# Patient Record
Sex: Female | Born: 1935 | Race: White | Hispanic: No | Marital: Married | Smoking: Never smoker
Health system: Southern US, Community
[De-identification: ages and names within clinical notes are randomized; demographics above are authoritative.]

## PROBLEM LIST (undated history)

## (undated) DIAGNOSIS — U071 COVID-19: Secondary | ICD-10-CM

## (undated) DIAGNOSIS — I10 Essential (primary) hypertension: Secondary | ICD-10-CM

## (undated) HISTORY — PX: APPENDECTOMY: SHX54

## (undated) HISTORY — PX: TONSILLECTOMY: SUR1361

## (undated) HISTORY — PX: ABDOMINAL HYSTERECTOMY: SHX81

---

## 1998-06-29 ENCOUNTER — Ambulatory Visit (HOSPITAL_COMMUNITY): Admission: RE | Admit: 1998-06-29 | Discharge: 1998-06-29 | Payer: Self-pay | Admitting: Neurosurgery

## 1998-07-13 ENCOUNTER — Ambulatory Visit (HOSPITAL_COMMUNITY): Admission: RE | Admit: 1998-07-13 | Discharge: 1998-07-13 | Payer: Self-pay | Admitting: Neurosurgery

## 1998-07-27 ENCOUNTER — Ambulatory Visit (HOSPITAL_COMMUNITY): Admission: RE | Admit: 1998-07-27 | Discharge: 1998-07-27 | Payer: Self-pay | Admitting: Neurosurgery

## 1999-12-15 ENCOUNTER — Other Ambulatory Visit: Admission: RE | Admit: 1999-12-15 | Discharge: 1999-12-15 | Payer: Self-pay | Admitting: Internal Medicine

## 2001-02-07 ENCOUNTER — Other Ambulatory Visit: Admission: RE | Admit: 2001-02-07 | Discharge: 2001-02-07 | Payer: Self-pay | Admitting: Internal Medicine

## 2001-03-11 ENCOUNTER — Ambulatory Visit (HOSPITAL_COMMUNITY): Admission: RE | Admit: 2001-03-11 | Discharge: 2001-03-11 | Payer: Self-pay | Admitting: Internal Medicine

## 2001-03-11 ENCOUNTER — Encounter: Payer: Self-pay | Admitting: Internal Medicine

## 2001-07-14 ENCOUNTER — Ambulatory Visit (HOSPITAL_COMMUNITY): Admission: RE | Admit: 2001-07-14 | Discharge: 2001-07-14 | Payer: Self-pay | Admitting: *Deleted

## 2001-07-14 ENCOUNTER — Encounter (INDEPENDENT_AMBULATORY_CARE_PROVIDER_SITE_OTHER): Payer: Self-pay | Admitting: Specialist

## 2002-05-15 ENCOUNTER — Other Ambulatory Visit: Admission: RE | Admit: 2002-05-15 | Discharge: 2002-05-15 | Payer: Self-pay | Admitting: Internal Medicine

## 2002-08-12 ENCOUNTER — Encounter: Payer: Self-pay | Admitting: Internal Medicine

## 2002-08-12 ENCOUNTER — Ambulatory Visit (HOSPITAL_COMMUNITY): Admission: RE | Admit: 2002-08-12 | Discharge: 2002-08-12 | Payer: Self-pay | Admitting: Internal Medicine

## 2004-04-24 ENCOUNTER — Encounter: Admission: RE | Admit: 2004-04-24 | Discharge: 2004-04-24 | Payer: Self-pay | Admitting: Internal Medicine

## 2004-06-05 ENCOUNTER — Ambulatory Visit (HOSPITAL_COMMUNITY): Admission: RE | Admit: 2004-06-05 | Discharge: 2004-06-05 | Payer: Self-pay | Admitting: Internal Medicine

## 2004-10-26 ENCOUNTER — Encounter: Admission: RE | Admit: 2004-10-26 | Discharge: 2004-10-26 | Payer: Self-pay | Admitting: Internal Medicine

## 2005-02-14 ENCOUNTER — Ambulatory Visit (HOSPITAL_COMMUNITY): Admission: RE | Admit: 2005-02-14 | Discharge: 2005-02-14 | Payer: Self-pay | Admitting: *Deleted

## 2005-02-14 ENCOUNTER — Encounter (INDEPENDENT_AMBULATORY_CARE_PROVIDER_SITE_OTHER): Payer: Self-pay | Admitting: Specialist

## 2005-06-06 ENCOUNTER — Ambulatory Visit (HOSPITAL_COMMUNITY): Admission: RE | Admit: 2005-06-06 | Discharge: 2005-06-06 | Payer: Self-pay | Admitting: Internal Medicine

## 2006-05-09 ENCOUNTER — Encounter: Admission: RE | Admit: 2006-05-09 | Discharge: 2006-05-09 | Payer: Self-pay | Admitting: Internal Medicine

## 2006-09-17 ENCOUNTER — Ambulatory Visit (HOSPITAL_COMMUNITY): Admission: RE | Admit: 2006-09-17 | Discharge: 2006-09-17 | Payer: Self-pay | Admitting: Internal Medicine

## 2007-10-03 ENCOUNTER — Ambulatory Visit (HOSPITAL_COMMUNITY): Admission: RE | Admit: 2007-10-03 | Discharge: 2007-10-03 | Payer: Self-pay | Admitting: Internal Medicine

## 2008-07-25 ENCOUNTER — Emergency Department (HOSPITAL_COMMUNITY): Admission: EM | Admit: 2008-07-25 | Discharge: 2008-07-25 | Payer: Self-pay | Admitting: Emergency Medicine

## 2009-04-01 ENCOUNTER — Ambulatory Visit (HOSPITAL_COMMUNITY): Admission: RE | Admit: 2009-04-01 | Discharge: 2009-04-01 | Payer: Self-pay | Admitting: Internal Medicine

## 2009-06-25 ENCOUNTER — Emergency Department (HOSPITAL_COMMUNITY): Admission: EM | Admit: 2009-06-25 | Discharge: 2009-06-25 | Payer: Self-pay | Admitting: Emergency Medicine

## 2009-06-28 ENCOUNTER — Emergency Department (HOSPITAL_COMMUNITY): Admission: EM | Admit: 2009-06-28 | Discharge: 2009-06-28 | Payer: Self-pay | Admitting: Emergency Medicine

## 2009-06-28 ENCOUNTER — Ambulatory Visit (HOSPITAL_COMMUNITY): Admission: RE | Admit: 2009-06-28 | Discharge: 2009-06-28 | Payer: Self-pay | Admitting: Orthopaedic Surgery

## 2009-07-01 ENCOUNTER — Ambulatory Visit (HOSPITAL_COMMUNITY): Admission: RE | Admit: 2009-07-01 | Discharge: 2009-07-01 | Payer: Self-pay | Admitting: Orthopaedic Surgery

## 2009-07-21 ENCOUNTER — Encounter: Payer: Self-pay | Admitting: Orthopaedic Surgery

## 2009-07-22 ENCOUNTER — Encounter (INDEPENDENT_AMBULATORY_CARE_PROVIDER_SITE_OTHER): Payer: Self-pay | Admitting: Interventional Radiology

## 2009-07-22 ENCOUNTER — Ambulatory Visit (HOSPITAL_COMMUNITY): Admission: RE | Admit: 2009-07-22 | Discharge: 2009-07-22 | Payer: Self-pay | Admitting: Orthopaedic Surgery

## 2009-07-27 ENCOUNTER — Ambulatory Visit (HOSPITAL_COMMUNITY): Admission: RE | Admit: 2009-07-27 | Discharge: 2009-07-27 | Payer: Self-pay | Admitting: Orthopaedic Surgery

## 2009-08-01 ENCOUNTER — Inpatient Hospital Stay (HOSPITAL_COMMUNITY): Admission: EM | Admit: 2009-08-01 | Discharge: 2009-08-08 | Payer: Self-pay | Admitting: Emergency Medicine

## 2009-08-04 ENCOUNTER — Ambulatory Visit: Payer: Self-pay | Admitting: Infectious Diseases

## 2009-08-08 ENCOUNTER — Inpatient Hospital Stay: Admission: AD | Admit: 2009-08-08 | Discharge: 2009-09-30 | Payer: Self-pay | Admitting: Internal Medicine

## 2009-08-16 ENCOUNTER — Ambulatory Visit (HOSPITAL_COMMUNITY): Admission: RE | Admit: 2009-08-16 | Discharge: 2009-08-16 | Payer: Self-pay | Admitting: Internal Medicine

## 2009-08-25 ENCOUNTER — Ambulatory Visit (HOSPITAL_COMMUNITY): Admission: RE | Admit: 2009-08-25 | Discharge: 2009-08-25 | Payer: Self-pay | Admitting: Internal Medicine

## 2009-09-30 ENCOUNTER — Inpatient Hospital Stay (HOSPITAL_COMMUNITY): Admission: RE | Admit: 2009-09-30 | Discharge: 2009-10-04 | Payer: Self-pay | Admitting: General Surgery

## 2009-10-04 ENCOUNTER — Inpatient Hospital Stay: Admission: AD | Admit: 2009-10-04 | Discharge: 2009-10-29 | Payer: Self-pay | Admitting: Internal Medicine

## 2009-11-16 ENCOUNTER — Encounter: Admission: RE | Admit: 2009-11-16 | Discharge: 2009-11-16 | Payer: Self-pay | Admitting: Internal Medicine

## 2009-12-03 IMAGING — CR DG CHEST 2V
1 series · 1 of 1 positions shown · non-contrast
Comparison: Chest x-ray of 07/25/2008 and CT chest of 07/01/2009

CLINICAL DATA: Back pain

CHEST - 2 VIEW

[view not recorded]
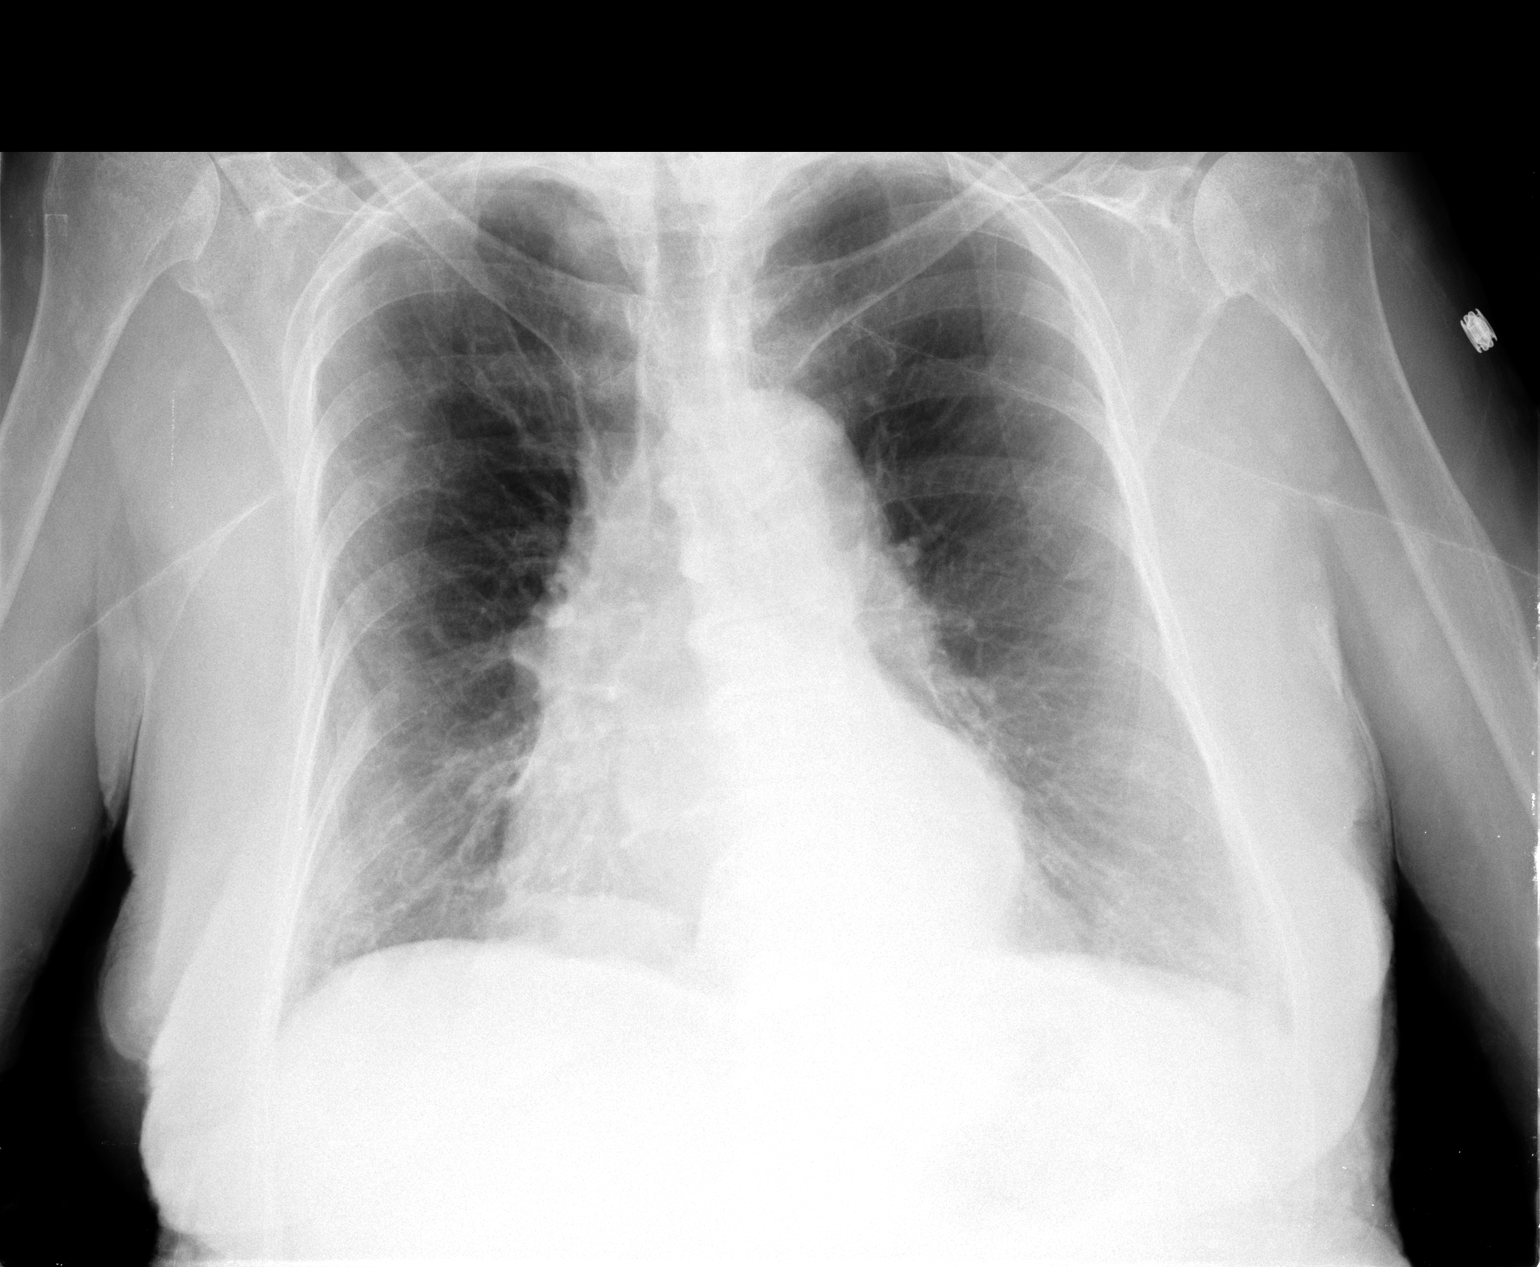

[1 of 1 positions shown; findings below may reference images not displayed]

FINDINGS: The lungs are clear.  Mild cardiomegaly is stable.
There are degenerative changes throughout the thoracic spine.  A
rounded opacity posteriorly at the left lung base may represent the
known posterior mediastinal mass demonstrated on the CT of the
chest of 07/01/2009.
IMPRESSION: 1.  No active lung disease.
2.  Stable mild cardiomegaly.
3.  Rounded mass posteriorly on the lateral view may represent the
previously demonstrated posterior mediastinal soft tissue mass.

## 2009-12-04 IMAGING — CR DG CHEST 1V PORT
1 series · 1 of 1 positions shown · non-contrast
Comparison: Chest radiograph performed 08/03/2009, and CT of the
chest performed 07/01/2009

CLINICAL DATA: Check PICC line placement.

PORTABLE CHEST - 1 VIEW

[view not recorded]
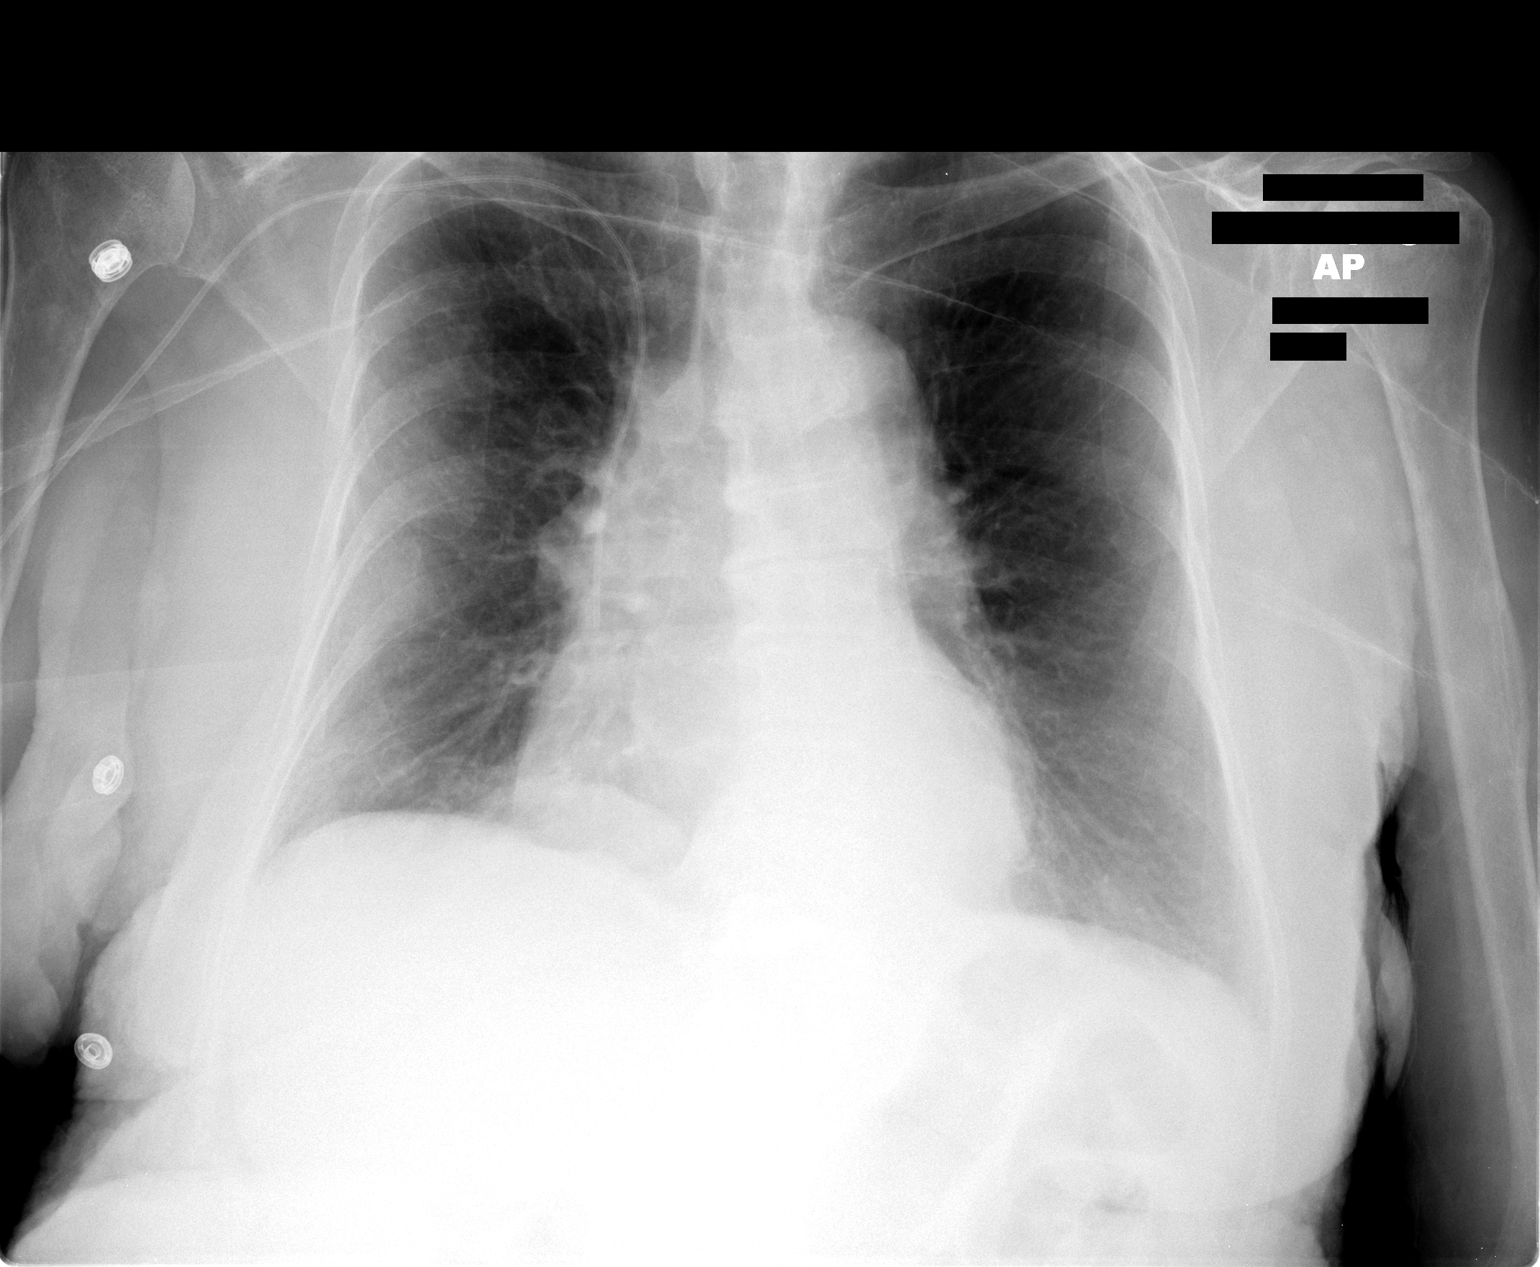

[1 of 1 positions shown; findings below may reference images not displayed]

FINDINGS: There has been interval placement of a right PICC, which
is seen ending at the distal SVC.

The lungs are well-aerated and clear.  There is no evidence of
focal opacification, pleural effusion or pneumothorax.

A left posterior mediastinal mass is unchanged in appearance.  A
rounded density at the right lung base reflects extrapleural fat,
as previously characterized.  No acute osseous abnormalities are
seen.
IMPRESSION: Right PICC noted ending at the distal SVC.

## 2009-12-05 IMAGING — CT CT GUIDANCE NEEDLE PLACEMENT
1 of 2 series · 9 of 14 positions shown, 12 images · non-contrast
Comparison: none

CLINICAL DATA: Persistent pain after T12 kyphoplasty with MRI
demonstrating abnormal soft tissue around the vertebral body.
Request has been made to try to aspirate fluid and/or tissue to
determine whether there is a component of infection.

[Series 2: t=12 asp · axial · 0.42mm/px · z∈[-344,-299]mm · 9 of 114 slices shown, 12 images]
[im 12/114  soft-tissue]
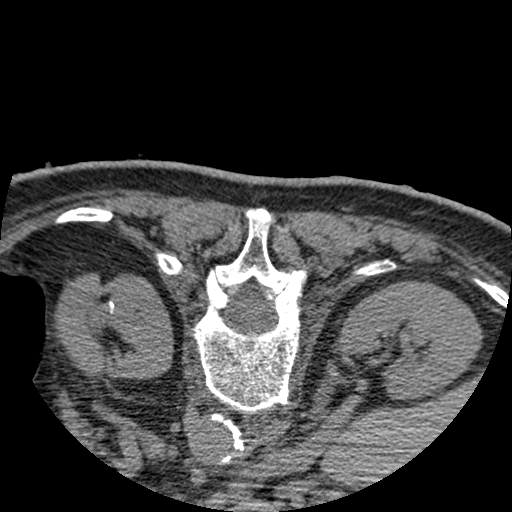
[im 12/114  bone]
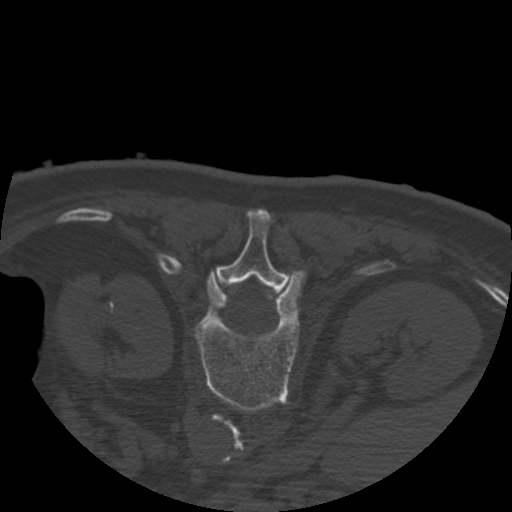
[im 23/114  bone]
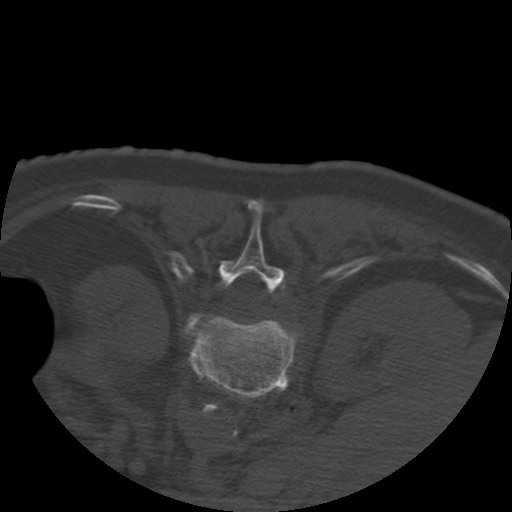
[im 34/114  bone]
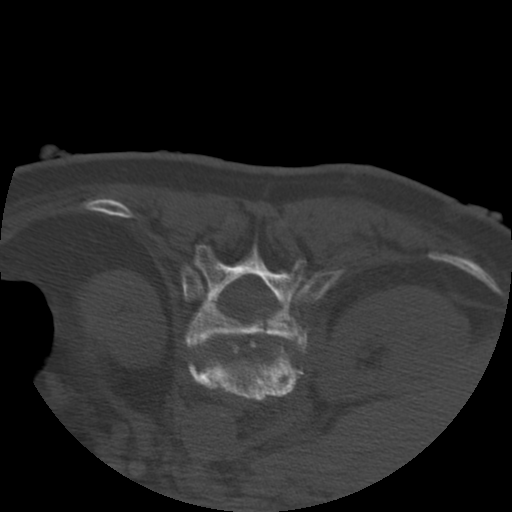
[im 46/114  bone]
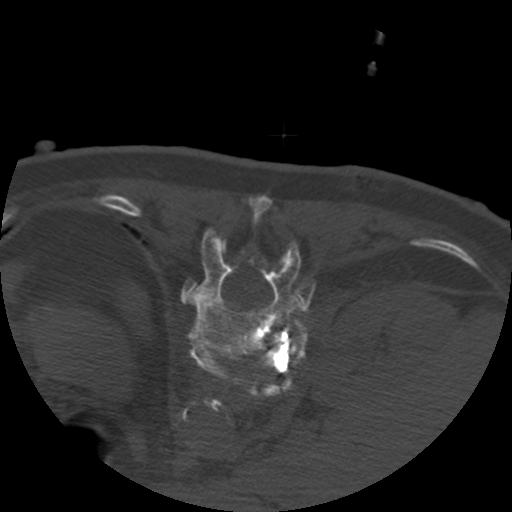
[im 57/114  soft-tissue]
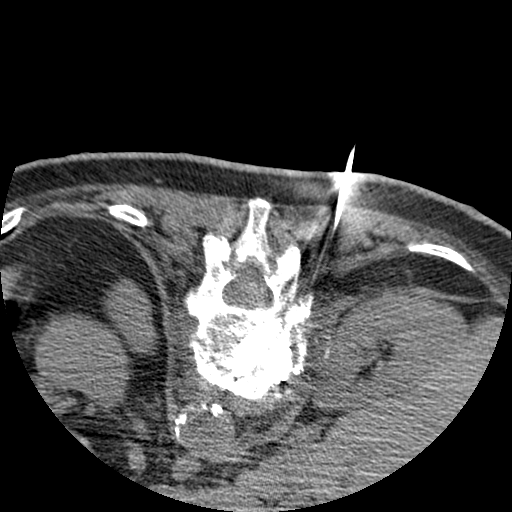
[im 57/114  bone]
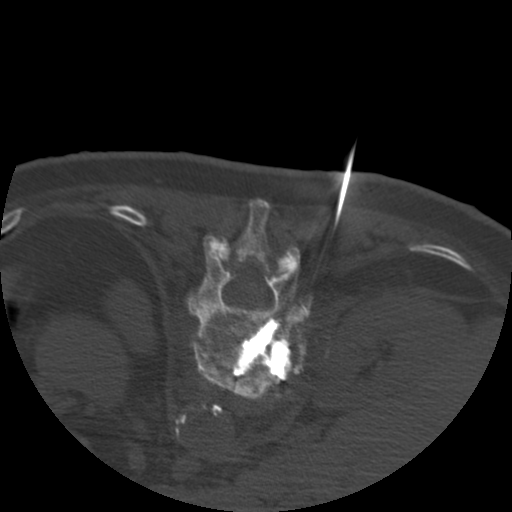
[im 68/114  bone]
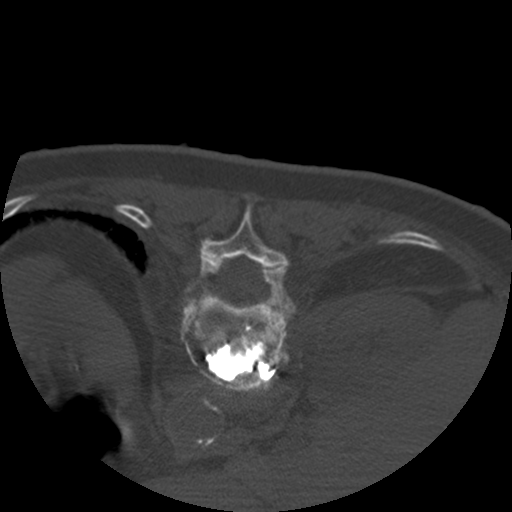
[im 80/114  bone]
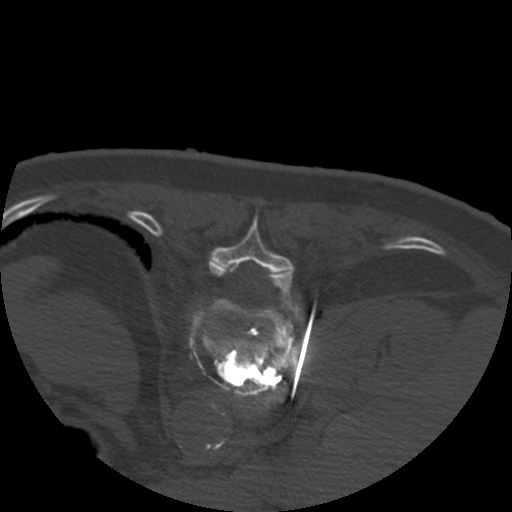
[im 91/114  bone]
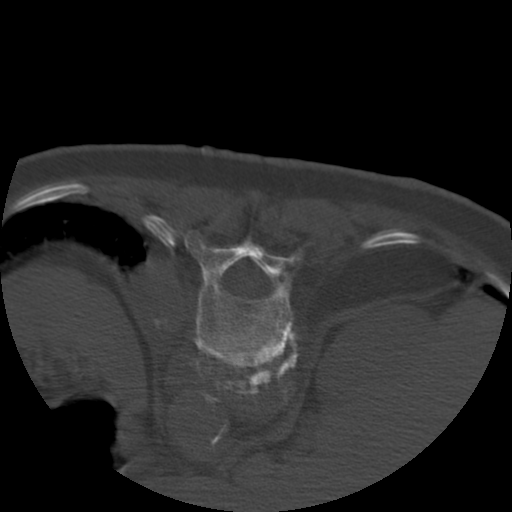
[im 102/114  soft-tissue]
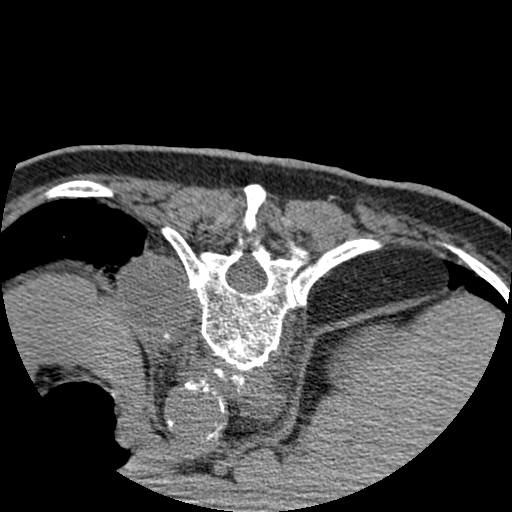
[im 102/114  bone]
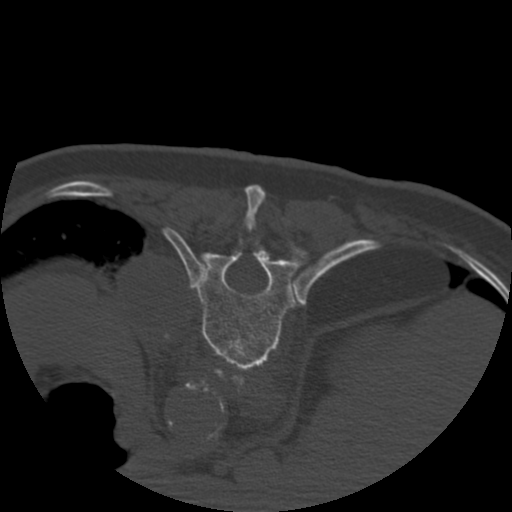

[9 of 14 positions shown; findings below may reference images not displayed]

CT GUIDED NEEDLE ASPIRATION OF RIGHT SIDED THORACIC PARASPINAL SOFT
TISSUE

Sedation: Versed 0.5 mg IV, Fentanyl 25 mcg IV

Total Moderate Sedation Time: 20 minutes.

Procedure:  The procedure risks, benefits, and alternatives were
explained to the patient.  Questions regarding the procedure were
encouraged and answered.  The patient understands and consents to
the procedure.

The right thoracic paraspinal region was prepped with betadine in a
sterile fashion, and a sterile drape was applied covering the
operative field.  A sterile gown and sterile gloves were used for
the procedure.  Local anesthesia was provided with 1% Lidocaine.

Under CT guidance, an 18 gauge trocar needle was advanced into the
right paraspinous soft tissues adjacent to the T12 vertebral body.
Aspiration was performed.  2 ml of sterile saline was then injected
into the paraspinous space and lavage performed.  Aspirated fluid
was sent for culture studies.

Complications: None
FINDINGS: Initial aspiration to the right of the T12 vertebral body
did not reveal any return of free fluid.  Sterile saline lavage was
therefore performed yielding some bloody fluid.  The fluid sample
was sent for culture studies.
IMPRESSION: CT guided paraspinous aspiration at the T12 level to the right of
the T12 vertebral body.  There was no return of free fluid.
Sterile saline lavage was performed and fluid sample sent for
culture studies.

## 2010-01-23 ENCOUNTER — Encounter: Admission: RE | Admit: 2010-01-23 | Discharge: 2010-01-23 | Payer: Self-pay | Admitting: Orthopedic Surgery

## 2010-01-31 ENCOUNTER — Encounter (INDEPENDENT_AMBULATORY_CARE_PROVIDER_SITE_OTHER): Payer: Self-pay | Admitting: Internal Medicine

## 2010-01-31 ENCOUNTER — Ambulatory Visit (HOSPITAL_COMMUNITY): Admission: RE | Admit: 2010-01-31 | Discharge: 2010-01-31 | Payer: Self-pay | Admitting: Internal Medicine

## 2010-03-07 ENCOUNTER — Encounter: Admission: RE | Admit: 2010-03-07 | Discharge: 2010-03-07 | Payer: Self-pay | Admitting: Unknown Physician Specialty

## 2010-03-21 ENCOUNTER — Encounter: Admission: RE | Admit: 2010-03-21 | Discharge: 2010-03-21 | Payer: Self-pay | Admitting: Unknown Physician Specialty

## 2010-12-11 ENCOUNTER — Encounter: Payer: Self-pay | Admitting: Interventional Radiology

## 2010-12-15 ENCOUNTER — Ambulatory Visit (HOSPITAL_COMMUNITY)
Admission: RE | Admit: 2010-12-15 | Discharge: 2010-12-15 | Payer: Self-pay | Source: Home / Self Care | Attending: Internal Medicine | Admitting: Internal Medicine

## 2011-02-21 LAB — COMPREHENSIVE METABOLIC PANEL
ALT: 26 U/L (ref 0–35)
Albumin: 3.2 g/dL — ABNORMAL LOW (ref 3.5–5.2)
Alkaline Phosphatase: 112 U/L (ref 39–117)
BUN: 19 mg/dL (ref 6–23)
Chloride: 99 mEq/L (ref 96–112)
Glucose, Bld: 133 mg/dL — ABNORMAL HIGH (ref 70–99)
Potassium: 4.9 mEq/L (ref 3.5–5.1)
Sodium: 129 mEq/L — ABNORMAL LOW (ref 135–145)
Total Bilirubin: 0.5 mg/dL (ref 0.3–1.2)
Total Protein: 6.1 g/dL (ref 6.0–8.3)

## 2011-02-21 LAB — DIFFERENTIAL
Basophils Absolute: 0 10*3/uL (ref 0.0–0.1)
Basophils Relative: 0 % (ref 0–1)
Eosinophils Absolute: 0.4 10*3/uL (ref 0.0–0.7)
Monocytes Absolute: 0.8 10*3/uL (ref 0.1–1.0)
Monocytes Relative: 8 % (ref 3–12)
Neutro Abs: 8.7 10*3/uL — ABNORMAL HIGH (ref 1.7–7.7)
Neutrophils Relative %: 79 % — ABNORMAL HIGH (ref 43–77)

## 2011-02-21 LAB — URINALYSIS, ROUTINE W REFLEX MICROSCOPIC
Glucose, UA: NEGATIVE mg/dL
Nitrite: POSITIVE — AB
Specific Gravity, Urine: 1.019 (ref 1.005–1.030)
pH: 6 (ref 5.0–8.0)

## 2011-02-21 LAB — CBC
HCT: 40.3 % (ref 36.0–46.0)
Hemoglobin: 13.8 g/dL (ref 12.0–15.0)
RDW: 14.9 % (ref 11.5–15.5)
WBC: 11.1 10*3/uL — ABNORMAL HIGH (ref 4.0–10.5)

## 2011-02-21 LAB — URINE CULTURE

## 2011-02-21 LAB — URINE MICROSCOPIC-ADD ON

## 2011-02-22 LAB — GLUCOSE, CAPILLARY: Glucose-Capillary: 120 mg/dL — ABNORMAL HIGH (ref 70–99)

## 2011-02-23 LAB — CBC
HCT: 38.1 % (ref 36.0–46.0)
HCT: 41.3 % (ref 36.0–46.0)
HCT: 41.9 % (ref 36.0–46.0)
HCT: 45.1 % (ref 36.0–46.0)
HCT: 45.1 % (ref 36.0–46.0)
HCT: 45.2 % (ref 36.0–46.0)
Hemoglobin: 13 g/dL (ref 12.0–15.0)
Hemoglobin: 13.9 g/dL (ref 12.0–15.0)
Hemoglobin: 14.9 g/dL (ref 12.0–15.0)
Hemoglobin: 15.1 g/dL — ABNORMAL HIGH (ref 12.0–15.0)
MCHC: 33.5 g/dL (ref 30.0–36.0)
MCHC: 34.1 g/dL (ref 30.0–36.0)
MCHC: 33.4 g/dL (ref 30.0–36.0)
MCV: 89.9 fL (ref 78.0–100.0)
MCV: 90.6 fL (ref 78.0–100.0)
Platelets: 501 10*3/uL — ABNORMAL HIGH (ref 150–400)
Platelets: 514 10*3/uL — ABNORMAL HIGH (ref 150–400)
Platelets: 514 10*3/uL — ABNORMAL HIGH (ref 150–400)
RBC: 4.12 MIL/uL (ref 3.87–5.11)
RBC: 4.61 MIL/uL (ref 3.87–5.11)
RBC: 4.95 MIL/uL (ref 3.87–5.11)
RDW: 14.6 % (ref 11.5–15.5)
RDW: 14.9 % (ref 11.5–15.5)
RDW: 14.1 % (ref 11.5–15.5)
RDW: 14.8 % (ref 11.5–15.5)
RDW: 14.9 % (ref 11.5–15.5)
WBC: 12.3 10*3/uL — ABNORMAL HIGH (ref 4.0–10.5)
WBC: 14 10*3/uL — ABNORMAL HIGH (ref 4.0–10.5)
WBC: 16.2 10*3/uL — ABNORMAL HIGH (ref 4.0–10.5)

## 2011-02-23 LAB — URINALYSIS, ROUTINE W REFLEX MICROSCOPIC
Bilirubin Urine: NEGATIVE
Glucose, UA: NEGATIVE mg/dL
Hgb urine dipstick: NEGATIVE
Ketones, ur: NEGATIVE mg/dL
Protein, ur: NEGATIVE mg/dL
pH: 7 (ref 5.0–8.0)

## 2011-02-23 LAB — BASIC METABOLIC PANEL
BUN: 14 mg/dL (ref 6–23)
CO2: 25 mEq/L (ref 19–32)
CO2: 25 mEq/L (ref 19–32)
CO2: 28 mEq/L (ref 19–32)
Chloride: 92 mEq/L — ABNORMAL LOW (ref 96–112)
Chloride: 95 mEq/L — ABNORMAL LOW (ref 96–112)
Chloride: 95 mEq/L — ABNORMAL LOW (ref 96–112)
Chloride: 98 mEq/L (ref 96–112)
Creatinine, Ser: 0.42 mg/dL (ref 0.4–1.2)
GFR calc Af Amer: >60 mL/min (ref >60)
GFR calc Af Amer: >60 mL/min (ref >60)
GFR calc Af Amer: >60 mL/min (ref >60)
GFR calc non Af Amer: >60 mL/min (ref >60)
Glucose, Bld: 116 mg/dL — ABNORMAL HIGH (ref 70–99)
Glucose, Bld: 132 mg/dL — ABNORMAL HIGH (ref 70–99)
Potassium: 3.7 mEq/L (ref 3.5–5.1)
Potassium: 4.1 mEq/L (ref 3.5–5.1)
Potassium: 4.7 mEq/L (ref 3.5–5.1)
Potassium: 4.8 mEq/L (ref 3.5–5.1)
Sodium: 128 mEq/L — ABNORMAL LOW (ref 135–145)
Sodium: 133 mEq/L — ABNORMAL LOW (ref 135–145)
Sodium: 133 mEq/L — ABNORMAL LOW (ref 135–145)

## 2011-02-23 LAB — COMPREHENSIVE METABOLIC PANEL
ALT: 13 U/L (ref 0–35)
AST: 19 U/L (ref 0–37)
Albumin: 2.9 g/dL — ABNORMAL LOW (ref 3.5–5.2)
Alkaline Phosphatase: 73 U/L (ref 39–117)
Alkaline Phosphatase: 79 U/L (ref 39–117)
BUN: 13 mg/dL (ref 6–23)
BUN: 12 mg/dL (ref 6–23)
Calcium: 8.3 mg/dL — ABNORMAL LOW (ref 8.4–10.5)
Chloride: 96 mEq/L (ref 96–112)
GFR calc Af Amer: >60 mL/min (ref >60)
Glucose, Bld: 108 mg/dL — ABNORMAL HIGH (ref 70–99)
Potassium: 3.4 mEq/L — ABNORMAL LOW (ref 3.5–5.1)
Sodium: 134 mEq/L — ABNORMAL LOW (ref 135–145)
Total Bilirubin: 0.7 mg/dL (ref 0.3–1.2)
Total Protein: 5.8 g/dL — ABNORMAL LOW (ref 6.0–8.3)
Total Protein: 6.8 g/dL (ref 6.0–8.3)

## 2011-02-23 LAB — ANAEROBIC CULTURE

## 2011-02-23 LAB — DIFFERENTIAL
Eosinophils Absolute: 0 10*3/uL (ref 0.0–0.7)
Eosinophils Relative: 0 % (ref 0–5)
Lymphocytes Relative: 12 % (ref 12–46)
Lymphs Abs: 1.6 10*3/uL (ref 0.7–4.0)
Monocytes Absolute: 0.9 10*3/uL (ref 0.1–1.0)

## 2011-02-23 LAB — CULTURE, BLOOD (ROUTINE X 2)
Culture: NO GROWTH
Culture: NO GROWTH
Culture: NO GROWTH

## 2011-02-23 LAB — WOUND CULTURE: Culture: NO GROWTH

## 2011-02-23 LAB — CARDIAC PANEL(CRET KIN+CKTOT+MB+TROPI)
CK, MB: 8.9 ng/mL — ABNORMAL HIGH (ref 0.3–4.0)
Relative Index: 4 — ABNORMAL HIGH (ref 0.0–2.5)
Total CK: 222 U/L — ABNORMAL HIGH (ref 7–177)
Troponin I: 0.02 ng/mL (ref 0.00–0.06)

## 2011-02-23 LAB — URINE CULTURE: Colony Count: >=100000

## 2011-02-23 LAB — SYNOVIAL CELL COUNT + DIFF, W/ CRYSTALS
Crystals, Fluid: NONE SEEN
Lymphocytes-Synovial Fld: 5 % (ref 0–20)
Monocyte-Macrophage-Synovial Fluid: 3 % — ABNORMAL LOW (ref 50–90)
WBC, Synovial: 18 /mm3 (ref 0–200)

## 2011-02-23 LAB — PROTIME-INR
INR: 1.1 (ref 0.00–1.49)
Prothrombin Time: 14.1 seconds (ref 11.6–15.2)

## 2011-02-23 LAB — T4, FREE: Free T4: 1.4 ng/dL (ref 0.80–1.80)

## 2011-02-23 LAB — OSMOLALITY: Osmolality: 259 mOsm/kg — ABNORMAL LOW (ref 275–300)

## 2011-02-23 LAB — SEDIMENTATION RATE: Sed Rate: 30 mm/hr — ABNORMAL HIGH (ref 0–22)

## 2011-02-23 LAB — OSMOLALITY, URINE: Osmolality, Ur: 294 mOsm/kg — ABNORMAL LOW (ref 390–1090)

## 2011-04-03 NOTE — H&P (Signed)
Tiffany Shannon, Tiffany Shannon               ACCOUNT NO.:  [id number]   MEDICAL RECORD NO.:  [id number]          PATIENT TYPE:  OUT   LOCATION:  XRAY                         FACILITY:  [facility]   PHYSICIAN:  Sanjeev K. Deveshwar, M.D.DATE OF BIRTH:  Jul 20, 1923   DATE OF ADMISSION:  07/21/2009  DATE OF DISCHARGE:  07/21/2009                              HISTORY & PHYSICAL   CHIEF COMPLAINT:  Back pain.   HISTORY OF PRESENT ILLNESS:  This is a very pleasant 77 year old female  who fell on August 2 when her feet became tangled.  She developed some  back pain at that time; however, approximately 4 days later, the pain  became significantly worse.  The patient was seen at [facility]  Emergency Department.  She was referred to Dr. Hilda Lias for further  evaluation.  An MRI was performed on June 28, 2009, that showed a T12  compression fracture which was felt to be acute or subacute with minimal  retropulsion.  The patient was also found to have a left inferior lung  mass with further evaluation recommended.  She was noted to have  anterior subluxation of L4 on L5 with some mild-to-moderate stenosis.  The patient was placed in a CASH brace.  She has been on oxycodone for  pain; however, she still has significant pain which she rates as a 10 on  a 1-10 scale.  The patient has been very debilitated since her injury.  She has been referred to Dr. Corliss Skains through the courtesy of Dr.  Hilda Lias and presents today with her husband to discuss treatment  options.   PAST MEDICAL HISTORY:  Significant for hypertension, arthritis,  osteoporosis.  She had a left mediastinal mass noted on her MRI,  although she had a CT scan performed on July 01, 2009.  It was felt  that the mass was possibly a neurofibroma and clinical correlation was  recommended.   SURGICAL HISTORY:  Significant for tonsillectomy, appendectomy,  hysterectomy, and knee surgery.   ALLERGIES:  The patient is allergic to CODEINE.   CURRENT MEDICATIONS:  Zebeta 10 mg 2 tablets daily, vitamin D daily,  aspirin 81 mg daily, multivitamin daily, diazepam 10 mg p.r.n.,  oxycodone 7.5 mg/325 mg one q.4-6 h. p.r.n., Celebrex 200 mg b.i.d.   SOCIAL HISTORY:  The patient is married.  She and her husband live in  [place].  They have four grown children, three live in the area.  The  patient has never been a smoker.  She drinks alcohol rarely.  She is a  retired Public librarian.   FAMILY HISTORY:  Her mother died at age 79 from natural causes.  Her  father's medical history is unknown.   IMPRESSION AND PLAN:  The patient presents today accompanied by her  husband to discuss further treatment options for a T12 compression  fracture which has caused her severe pain and disability despite being  placed in a CASH brace and taking oxycodone almost around the clock.  Compression fractures were explained as well as the kyphoplasty and  vertebroplasty procedures.  The patient and her husband were shown  animated videos detailing compression fractures and the kyphoplasty  procedures.  The procedures were described in detail along with  potential risks and benefits.   Dr. Corliss Skains reviewed the images from the patient's MRI and a recent  plain film from Dr. Sanjuan Dame office.  He pointed out the fracture to  the patient and her husband.  Treatment options were discussed including  continued limited activity with continued pain medication with eventual  natural healing versus the kyphoplasty and vertebroplasty procedures.  The patient and her husband are anxious to move forward with the  intervention in order to stabilize the fracture and relieve some of her  very severe pain.  We have scheduled her for tomorrow morning for a T12  kyphoplasty or vertebroplasty.  All of their questions were answered.  Greater than 45 minutes were spent on this new patient evaluation.  They  were also given a prescription for Vicodin 5/325 to be  taken one q.4-6  h. P.r.n., #20, no refills as they were almost out of the oxycodone.  They were also advised to have the patient take MiraLax or Dulcolax tabs  as she has been experiencing some constipation.      Delton See, P.A.    ______________________________  Grandville Silos. Corliss Skains, M.D.    DR/MEDQ  D:  07/21/2009  T:  07/22/2009  Job:  [id number]   cc:   Massie Maroon, MD

## 2011-04-06 NOTE — Op Note (Signed)
NAMECHUDNEY, SCHEFFLER NO.:  [id number]   MEDICAL RECORD NO.:  [id number]          PATIENT TYPE:  AMB   LOCATION:  ENDO                         FACILITY:  [facility]   PHYSICIAN:  Georgiana Spinner, M.D.    DATE OF BIRTH:  [birth year]   DATE OF PROCEDURE:  02/14/2005  DATE OF DISCHARGE:                                 OPERATIVE REPORT   PROCEDURE:  Colonoscopy.   INDICATIONS:  Hemoccult positivity.   ANESTHESIA:  1.  Demerol 20.  2.  Versed 2 mg.   PROCEDURE:  With the patient mildly sedated in the left lateral decubitus  position, the Olympus videoscopic colonoscope was inserted in the rectum,  passed under direct vision to the cecum identified by ileocecal valve from  this point, and appendiceal orifice, the former of which was photographed.  From this point the colonoscope was slowly withdrawn, taking circumferential  views of the colonic mucosa, stopping to photograph diverticula along the  way and also to stop in the ascending colon where a small polyp was seen and  removed using hot biopsy forceps technique, a setting of 20/200 blended  current.  Endoscope was then withdrawn all the way to the rectum, which  appeared normal on direct and retroflexed view.  The endoscope was  straightened and withdrawn.  Patient's vital signs and pulse oximeter  remained stable.  Patient tolerated the procedure well without apparent  complications.   FINDINGS:  Small polyp of ascending colon, mild diverticulosis of the  sigmoid colon, otherwise unremarkable exam.   PLAN:  Await biopsy report, patient will call me with results and followup  with me as an outpatient.      GMO/MEDQ  D:  02/14/2005  T:  02/14/2005  Job:  [id number]

## 2011-04-06 NOTE — Procedures (Signed)
[facility]  Patient:    Tiffany Shannon, Tiffany Shannon Visit Number: [id number] MRN: [id number]          Service Type: END Location: ENDO Attending Physician:  Sabino Gasser Proc. Date: 07/14/01 Adm. Date:  07/14/2001                             Procedure Report  PROCEDURE:  Colonoscopy.  INDICATIONS:  Rectal bleeding.  ANESTHESIA:  Demerol 50, Versed 5 mg.  DESCRIPTION OF PROCEDURE:  With the patient mildly sedated in the left lateral decubitus position, the Olympus videoscopic colonoscope was inserted in the rectum and passed under direct vision to the cecum, identified by the ileocecal valve and appendiceal orifice, both of which were photographed. From this point, the colonoscope was slowly withdrawn taking circumferential views of the entire colonic mucosa stopping first at 50 cm from the anal verge, at which point a small polyp was seen, photographed, and removed using hot biopsy forceps technique, and a second polyp was seen just distal to this, which was removed using snare cautery technique.  Both were at a setting of 3-3 blended current.  Diverticulosis was seen in the sigmoid colon until we reached the rectum which appeared normal on direct and retroflexed view.  The endoscope was straightened and withdrawn.  The patients vital signs and pulse oximeter remained stable.  The patient tolerated the procedure well without apparent complications.  FINDINGS:  Diverticulosis of sigmoid colon.  Polyps at 50 cm from the anal verge and in the rectosigmoid.  PLAN:  Await biopsy report.  The patient will call me for results and follow up with me as an outpatient. Attending Physician:  Sabino Gasser DD:  07/14/01 TD:  07/14/01 Job: [id number]

## 2011-04-06 NOTE — Op Note (Signed)
NAME:  Tiffany Shannon, Tiffany Shannon                   ACCOUNT NO.:  [id number]   MEDICAL RECORD NO.:  [id number]          PATIENT TYPE:  AMB   LOCATION:  ENDO                         [facility]ACILITY:  [facility]   PHYSICIAN:  Georgiana Spinner, M.D.    DATE OF BIRTH:  11-Jan-1923   DATE OF PROCEDURE:  DATE OF DISCHARGE:                                 OPERATIVE REPORT   PROCEDURE:  Upper endoscopy.   INDICATIONS:  Gastroesophageal reflux disease.  Heme-positive stools as  well.   ANESTHESIA:  Demerol 40 mg, Versed 5 mg.   DESCRIPTION OF PROCEDURE:  With the patient mildly sedated in the left  lateral decubitus position, the Olympus videoscopic endoscope was inserted  in the mouth and passed under direct vision through the esophagus.  In the  distal esophagus there appeared to be a dilated vein, not truly a varix.  This was photographed and we were able to get around this fairly easily.  It  was compressible and the distal esophagus was seen.  There was no evidence  of Barrett's.  The endoscope was advanced into the stomach.  The  fundus,  body, antrum, duodenal bulb, and second portion of the duodenum all appeared  normal.  From this point, the endoscope was slowly withdrawn taking  circumferential views of the duodenal mucosa until the endoscope had been  pulled back into the stomach, placed in retroflexion and viewed the stomach  from below.  The endoscope was then straightened, withdrawn, taking  circumferential views of the remaining gastric and esophageal mucosa.  The  patient's vital signs and pulse oximetry remained stable.  The patient  tolerated the procedure well without apparent complication.   FINDINGS:  Question of a distended vein or varix at the distal esophagus.  Otherwise unremarkable exam.   PLAN:  Proceed to colonoscopy.      GMO/MEDQ  D:  02/14/2005  T:  02/14/2005  Job:  [id number]

## 2011-12-13 DIAGNOSIS — I70209 Unspecified atherosclerosis of native arteries of extremities, unspecified extremity: Secondary | ICD-10-CM | POA: Diagnosis not present

## 2012-01-17 DIAGNOSIS — Z79899 Other long term (current) drug therapy: Secondary | ICD-10-CM | POA: Diagnosis not present

## 2012-01-18 ENCOUNTER — Ambulatory Visit
Admission: RE | Admit: 2012-01-18 | Discharge: 2012-01-18 | Disposition: A | Discharge status: Home / Self Care | Payer: Medicare Other | Source: Ambulatory Visit | Attending: *Deleted | Admitting: *Deleted

## 2012-01-18 ENCOUNTER — Other Ambulatory Visit: Payer: Self-pay | Admitting: Pain Medicine

## 2012-01-18 ENCOUNTER — Other Ambulatory Visit: Payer: Self-pay | Admitting: *Deleted

## 2012-01-18 DIAGNOSIS — W19XXXA Unspecified fall, initial encounter: Secondary | ICD-10-CM

## 2012-01-18 DIAGNOSIS — R52 Pain, unspecified: Secondary | ICD-10-CM

## 2012-01-18 DIAGNOSIS — M545 Low back pain: Secondary | ICD-10-CM | POA: Diagnosis not present

## 2012-01-18 DIAGNOSIS — M25559 Pain in unspecified hip: Secondary | ICD-10-CM | POA: Diagnosis not present

## 2012-01-29 DIAGNOSIS — M25559 Pain in unspecified hip: Secondary | ICD-10-CM | POA: Diagnosis not present

## 2012-01-29 DIAGNOSIS — M76899 Other specified enthesopathies of unspecified lower limb, excluding foot: Secondary | ICD-10-CM | POA: Diagnosis not present

## 2012-02-21 DIAGNOSIS — I70209 Unspecified atherosclerosis of native arteries of extremities, unspecified extremity: Secondary | ICD-10-CM | POA: Diagnosis not present

## 2012-02-27 ENCOUNTER — Other Ambulatory Visit (HOSPITAL_COMMUNITY): Payer: Self-pay | Admitting: Internal Medicine

## 2012-02-27 DIAGNOSIS — I10 Essential (primary) hypertension: Secondary | ICD-10-CM | POA: Diagnosis not present

## 2012-02-27 DIAGNOSIS — M899 Disorder of bone, unspecified: Secondary | ICD-10-CM | POA: Diagnosis not present

## 2012-02-27 DIAGNOSIS — M949 Disorder of cartilage, unspecified: Secondary | ICD-10-CM | POA: Diagnosis not present

## 2012-02-27 DIAGNOSIS — R7309 Other abnormal glucose: Secondary | ICD-10-CM | POA: Diagnosis not present

## 2012-02-27 DIAGNOSIS — Z139 Encounter for screening, unspecified: Secondary | ICD-10-CM

## 2012-02-27 DIAGNOSIS — M818 Other osteoporosis without current pathological fracture: Secondary | ICD-10-CM | POA: Diagnosis not present

## 2012-02-29 ENCOUNTER — Ambulatory Visit (HOSPITAL_COMMUNITY)
Admission: RE | Admit: 2012-02-29 | Discharge: 2012-02-29 | Disposition: A | Discharge status: Home / Self Care | Payer: Medicare Other | Source: Ambulatory Visit | Attending: Internal Medicine | Admitting: Internal Medicine

## 2012-02-29 DIAGNOSIS — Z139 Encounter for screening, unspecified: Secondary | ICD-10-CM

## 2012-02-29 DIAGNOSIS — Z1231 Encounter for screening mammogram for malignant neoplasm of breast: Secondary | ICD-10-CM | POA: Diagnosis not present

## 2012-03-03 DIAGNOSIS — R7309 Other abnormal glucose: Secondary | ICD-10-CM | POA: Diagnosis not present

## 2012-03-03 DIAGNOSIS — M810 Age-related osteoporosis without current pathological fracture: Secondary | ICD-10-CM | POA: Diagnosis not present

## 2012-03-03 DIAGNOSIS — I10 Essential (primary) hypertension: Secondary | ICD-10-CM | POA: Diagnosis not present

## 2012-03-03 DIAGNOSIS — Q8501 Neurofibromatosis, type 1: Secondary | ICD-10-CM | POA: Diagnosis not present

## 2012-06-26 DIAGNOSIS — H01009 Unspecified blepharitis unspecified eye, unspecified eyelid: Secondary | ICD-10-CM | POA: Diagnosis not present

## 2012-07-03 DIAGNOSIS — I70209 Unspecified atherosclerosis of native arteries of extremities, unspecified extremity: Secondary | ICD-10-CM | POA: Diagnosis not present

## 2012-07-03 DIAGNOSIS — H01009 Unspecified blepharitis unspecified eye, unspecified eyelid: Secondary | ICD-10-CM | POA: Diagnosis not present

## 2012-07-07 DIAGNOSIS — M5137 Other intervertebral disc degeneration, lumbosacral region: Secondary | ICD-10-CM | POA: Diagnosis not present

## 2012-07-07 DIAGNOSIS — G8929 Other chronic pain: Secondary | ICD-10-CM | POA: Diagnosis not present

## 2012-07-07 DIAGNOSIS — Z79899 Other long term (current) drug therapy: Secondary | ICD-10-CM | POA: Diagnosis not present

## 2012-07-07 DIAGNOSIS — M1990 Unspecified osteoarthritis, unspecified site: Secondary | ICD-10-CM | POA: Diagnosis not present

## 2012-07-07 DIAGNOSIS — M79609 Pain in unspecified limb: Secondary | ICD-10-CM | POA: Diagnosis not present

## 2012-07-09 ENCOUNTER — Other Ambulatory Visit: Payer: Self-pay | Admitting: Pain Medicine

## 2012-07-09 DIAGNOSIS — M549 Dorsalgia, unspecified: Secondary | ICD-10-CM

## 2012-07-14 DIAGNOSIS — H01009 Unspecified blepharitis unspecified eye, unspecified eyelid: Secondary | ICD-10-CM | POA: Diagnosis not present

## 2012-07-24 ENCOUNTER — Ambulatory Visit
Admission: RE | Admit: 2012-07-24 | Discharge: 2012-07-24 | Disposition: A | Discharge status: Home / Self Care | Payer: Medicare Other | Source: Ambulatory Visit | Attending: Pain Medicine | Admitting: Pain Medicine

## 2012-07-24 DIAGNOSIS — M545 Low back pain, unspecified: Secondary | ICD-10-CM | POA: Diagnosis not present

## 2012-07-24 DIAGNOSIS — M549 Dorsalgia, unspecified: Secondary | ICD-10-CM

## 2012-07-28 DIAGNOSIS — M545 Low back pain: Secondary | ICD-10-CM | POA: Diagnosis not present

## 2012-07-31 DIAGNOSIS — M545 Low back pain: Secondary | ICD-10-CM | POA: Diagnosis not present

## 2012-08-04 DIAGNOSIS — M5137 Other intervertebral disc degeneration, lumbosacral region: Secondary | ICD-10-CM | POA: Diagnosis not present

## 2012-08-04 DIAGNOSIS — M5380 Other specified dorsopathies, site unspecified: Secondary | ICD-10-CM | POA: Diagnosis not present

## 2012-08-04 DIAGNOSIS — M48062 Spinal stenosis, lumbar region with neurogenic claudication: Secondary | ICD-10-CM | POA: Diagnosis not present

## 2012-08-04 DIAGNOSIS — M47817 Spondylosis without myelopathy or radiculopathy, lumbosacral region: Secondary | ICD-10-CM | POA: Diagnosis not present

## 2012-08-12 DIAGNOSIS — Z23 Encounter for immunization: Secondary | ICD-10-CM | POA: Diagnosis not present

## 2012-09-01 DIAGNOSIS — M5137 Other intervertebral disc degeneration, lumbosacral region: Secondary | ICD-10-CM | POA: Diagnosis not present

## 2012-09-01 DIAGNOSIS — Z79899 Other long term (current) drug therapy: Secondary | ICD-10-CM | POA: Diagnosis not present

## 2012-09-01 DIAGNOSIS — G894 Chronic pain syndrome: Secondary | ICD-10-CM | POA: Diagnosis not present

## 2012-09-01 DIAGNOSIS — M47817 Spondylosis without myelopathy or radiculopathy, lumbosacral region: Secondary | ICD-10-CM | POA: Diagnosis not present

## 2012-09-01 DIAGNOSIS — M8448XA Pathological fracture, other site, initial encounter for fracture: Secondary | ICD-10-CM | POA: Diagnosis not present

## 2012-09-03 DIAGNOSIS — E559 Vitamin D deficiency, unspecified: Secondary | ICD-10-CM | POA: Diagnosis not present

## 2012-09-03 DIAGNOSIS — R7309 Other abnormal glucose: Secondary | ICD-10-CM | POA: Diagnosis not present

## 2012-09-03 DIAGNOSIS — I10 Essential (primary) hypertension: Secondary | ICD-10-CM | POA: Diagnosis not present

## 2012-09-10 DIAGNOSIS — N390 Urinary tract infection, site not specified: Secondary | ICD-10-CM | POA: Diagnosis not present

## 2012-09-10 DIAGNOSIS — R32 Unspecified urinary incontinence: Secondary | ICD-10-CM | POA: Diagnosis not present

## 2012-09-10 DIAGNOSIS — R7309 Other abnormal glucose: Secondary | ICD-10-CM | POA: Diagnosis not present

## 2012-09-10 DIAGNOSIS — M818 Other osteoporosis without current pathological fracture: Secondary | ICD-10-CM | POA: Diagnosis not present

## 2012-09-10 DIAGNOSIS — I10 Essential (primary) hypertension: Secondary | ICD-10-CM | POA: Diagnosis not present

## 2012-09-11 DIAGNOSIS — I70209 Unspecified atherosclerosis of native arteries of extremities, unspecified extremity: Secondary | ICD-10-CM | POA: Diagnosis not present

## 2012-09-18 DIAGNOSIS — G5600 Carpal tunnel syndrome, unspecified upper limb: Secondary | ICD-10-CM | POA: Diagnosis not present

## 2012-09-29 DIAGNOSIS — M1990 Unspecified osteoarthritis, unspecified site: Secondary | ICD-10-CM | POA: Diagnosis not present

## 2012-09-29 DIAGNOSIS — G894 Chronic pain syndrome: Secondary | ICD-10-CM | POA: Diagnosis not present

## 2012-09-29 DIAGNOSIS — M47817 Spondylosis without myelopathy or radiculopathy, lumbosacral region: Secondary | ICD-10-CM | POA: Diagnosis not present

## 2012-09-29 DIAGNOSIS — M5137 Other intervertebral disc degeneration, lumbosacral region: Secondary | ICD-10-CM | POA: Diagnosis not present

## 2012-10-08 DIAGNOSIS — E559 Vitamin D deficiency, unspecified: Secondary | ICD-10-CM | POA: Diagnosis not present

## 2012-10-08 DIAGNOSIS — M810 Age-related osteoporosis without current pathological fracture: Secondary | ICD-10-CM | POA: Diagnosis not present

## 2012-10-08 DIAGNOSIS — R7309 Other abnormal glucose: Secondary | ICD-10-CM | POA: Diagnosis not present

## 2012-10-08 DIAGNOSIS — I10 Essential (primary) hypertension: Secondary | ICD-10-CM | POA: Diagnosis not present

## 2012-11-17 DIAGNOSIS — M48061 Spinal stenosis, lumbar region without neurogenic claudication: Secondary | ICD-10-CM | POA: Diagnosis not present

## 2012-11-17 DIAGNOSIS — G894 Chronic pain syndrome: Secondary | ICD-10-CM | POA: Diagnosis not present

## 2012-11-17 DIAGNOSIS — M1990 Unspecified osteoarthritis, unspecified site: Secondary | ICD-10-CM | POA: Diagnosis not present

## 2012-11-17 DIAGNOSIS — M79609 Pain in unspecified limb: Secondary | ICD-10-CM | POA: Diagnosis not present

## 2012-12-04 DIAGNOSIS — I70209 Unspecified atherosclerosis of native arteries of extremities, unspecified extremity: Secondary | ICD-10-CM | POA: Diagnosis not present

## 2012-12-30 DIAGNOSIS — Q8500 Neurofibromatosis, unspecified: Secondary | ICD-10-CM | POA: Diagnosis not present

## 2012-12-30 DIAGNOSIS — D235 Other benign neoplasm of skin of trunk: Secondary | ICD-10-CM | POA: Diagnosis not present

## 2013-01-13 DIAGNOSIS — IMO0001 Reserved for inherently not codable concepts without codable children: Secondary | ICD-10-CM | POA: Diagnosis not present

## 2013-02-19 DIAGNOSIS — I70209 Unspecified atherosclerosis of native arteries of extremities, unspecified extremity: Secondary | ICD-10-CM | POA: Diagnosis not present

## 2013-03-10 DIAGNOSIS — M5137 Other intervertebral disc degeneration, lumbosacral region: Secondary | ICD-10-CM | POA: Diagnosis not present

## 2013-03-10 DIAGNOSIS — M818 Other osteoporosis without current pathological fracture: Secondary | ICD-10-CM | POA: Diagnosis not present

## 2013-03-10 DIAGNOSIS — I10 Essential (primary) hypertension: Secondary | ICD-10-CM | POA: Diagnosis not present

## 2013-03-16 DIAGNOSIS — E559 Vitamin D deficiency, unspecified: Secondary | ICD-10-CM | POA: Diagnosis not present

## 2013-03-16 DIAGNOSIS — M818 Other osteoporosis without current pathological fracture: Secondary | ICD-10-CM | POA: Diagnosis not present

## 2013-03-16 DIAGNOSIS — I10 Essential (primary) hypertension: Secondary | ICD-10-CM | POA: Diagnosis not present

## 2013-03-16 DIAGNOSIS — R7309 Other abnormal glucose: Secondary | ICD-10-CM | POA: Diagnosis not present

## 2013-03-16 DIAGNOSIS — Z23 Encounter for immunization: Secondary | ICD-10-CM | POA: Diagnosis not present

## 2013-04-02 DIAGNOSIS — Q8501 Neurofibromatosis, type 1: Secondary | ICD-10-CM | POA: Diagnosis not present

## 2013-04-02 DIAGNOSIS — R32 Unspecified urinary incontinence: Secondary | ICD-10-CM | POA: Diagnosis not present

## 2013-04-02 DIAGNOSIS — I10 Essential (primary) hypertension: Secondary | ICD-10-CM | POA: Diagnosis not present

## 2013-04-02 DIAGNOSIS — E559 Vitamin D deficiency, unspecified: Secondary | ICD-10-CM | POA: Diagnosis not present

## 2013-04-06 DIAGNOSIS — H5200 Hypermetropia, unspecified eye: Secondary | ICD-10-CM | POA: Diagnosis not present

## 2013-04-06 DIAGNOSIS — H52229 Regular astigmatism, unspecified eye: Secondary | ICD-10-CM | POA: Diagnosis not present

## 2013-04-06 DIAGNOSIS — H524 Presbyopia: Secondary | ICD-10-CM | POA: Diagnosis not present

## 2013-04-06 DIAGNOSIS — H353190 Nonexudative age-related macular degeneration, unspecified eye, stage unspecified: Secondary | ICD-10-CM | POA: Diagnosis not present

## 2013-04-07 DIAGNOSIS — G894 Chronic pain syndrome: Secondary | ICD-10-CM | POA: Diagnosis not present

## 2013-04-07 DIAGNOSIS — M5137 Other intervertebral disc degeneration, lumbosacral region: Secondary | ICD-10-CM | POA: Diagnosis not present

## 2013-04-07 DIAGNOSIS — Z79899 Other long term (current) drug therapy: Secondary | ICD-10-CM | POA: Diagnosis not present

## 2013-04-07 DIAGNOSIS — M76899 Other specified enthesopathies of unspecified lower limb, excluding foot: Secondary | ICD-10-CM | POA: Diagnosis not present

## 2013-04-21 DIAGNOSIS — M5137 Other intervertebral disc degeneration, lumbosacral region: Secondary | ICD-10-CM | POA: Diagnosis not present

## 2013-04-28 DIAGNOSIS — E871 Hypo-osmolality and hyponatremia: Secondary | ICD-10-CM | POA: Diagnosis not present

## 2013-05-01 DIAGNOSIS — R7309 Other abnormal glucose: Secondary | ICD-10-CM | POA: Diagnosis not present

## 2013-05-01 DIAGNOSIS — I10 Essential (primary) hypertension: Secondary | ICD-10-CM | POA: Diagnosis not present

## 2013-05-01 DIAGNOSIS — E871 Hypo-osmolality and hyponatremia: Secondary | ICD-10-CM | POA: Diagnosis not present

## 2013-05-01 DIAGNOSIS — F329 Major depressive disorder, single episode, unspecified: Secondary | ICD-10-CM | POA: Diagnosis not present

## 2013-05-06 DIAGNOSIS — I10 Essential (primary) hypertension: Secondary | ICD-10-CM | POA: Diagnosis not present

## 2013-05-06 DIAGNOSIS — R350 Frequency of micturition: Secondary | ICD-10-CM | POA: Diagnosis not present

## 2013-05-06 DIAGNOSIS — E2749 Other adrenocortical insufficiency: Secondary | ICD-10-CM | POA: Diagnosis not present

## 2013-05-07 DIAGNOSIS — I70209 Unspecified atherosclerosis of native arteries of extremities, unspecified extremity: Secondary | ICD-10-CM | POA: Diagnosis not present

## 2013-05-13 ENCOUNTER — Other Ambulatory Visit (HOSPITAL_COMMUNITY): Payer: Self-pay | Admitting: *Deleted

## 2013-05-14 ENCOUNTER — Ambulatory Visit (HOSPITAL_COMMUNITY)
Admission: RE | Admit: 2013-05-14 | Discharge: 2013-05-14 | Disposition: A | Discharge status: Home / Self Care | Payer: Medicare Other | Source: Ambulatory Visit | Attending: Endocrinology | Admitting: Endocrinology

## 2013-05-14 DIAGNOSIS — E2749 Other adrenocortical insufficiency: Secondary | ICD-10-CM | POA: Insufficient documentation

## 2013-05-14 MED ORDER — COSYNTROPIN 0.25 MG IJ SOLR
0.2500 mg | INTRAMUSCULAR | Status: AC
  Administered 2013-05-14: 0.25 mg via INTRAVENOUS
  Filled 2013-05-14: qty 0.25

## 2013-05-15 LAB — ACTH: C206 ACTH: 9 pg/mL — ABNORMAL LOW (ref 10–46)

## 2013-05-16 LAB — ACTH STIMULATION, 3 TIME POINTS
Cortisol, 30 Min: 18.5 ug/dL — ABNORMAL LOW (ref >20)
Cortisol, 60 Min: 20.8 ug/dL (ref >20)
Cortisol, Base: 13.9 ug/dL

## 2013-05-26 ENCOUNTER — Other Ambulatory Visit (HOSPITAL_COMMUNITY): Payer: Self-pay | Admitting: Internal Medicine

## 2013-05-26 DIAGNOSIS — Z139 Encounter for screening, unspecified: Secondary | ICD-10-CM

## 2013-05-29 ENCOUNTER — Ambulatory Visit (HOSPITAL_COMMUNITY)
Admission: RE | Admit: 2013-05-29 | Discharge: 2013-05-29 | Disposition: A | Discharge status: Home / Self Care | Payer: Medicare Other | Source: Ambulatory Visit | Attending: Internal Medicine | Admitting: Internal Medicine

## 2013-05-29 DIAGNOSIS — Z1231 Encounter for screening mammogram for malignant neoplasm of breast: Secondary | ICD-10-CM | POA: Diagnosis not present

## 2013-05-29 DIAGNOSIS — Z139 Encounter for screening, unspecified: Secondary | ICD-10-CM

## 2013-06-05 DIAGNOSIS — I10 Essential (primary) hypertension: Secondary | ICD-10-CM | POA: Diagnosis not present

## 2013-06-05 DIAGNOSIS — E2749 Other adrenocortical insufficiency: Secondary | ICD-10-CM | POA: Diagnosis not present

## 2013-06-05 DIAGNOSIS — R32 Unspecified urinary incontinence: Secondary | ICD-10-CM | POA: Diagnosis not present

## 2013-06-12 DIAGNOSIS — I10 Essential (primary) hypertension: Secondary | ICD-10-CM | POA: Diagnosis not present

## 2013-06-12 DIAGNOSIS — R7309 Other abnormal glucose: Secondary | ICD-10-CM | POA: Diagnosis not present

## 2013-06-12 DIAGNOSIS — E2749 Other adrenocortical insufficiency: Secondary | ICD-10-CM | POA: Diagnosis not present

## 2013-07-03 DIAGNOSIS — E2749 Other adrenocortical insufficiency: Secondary | ICD-10-CM | POA: Diagnosis not present

## 2013-07-09 DIAGNOSIS — I70209 Unspecified atherosclerosis of native arteries of extremities, unspecified extremity: Secondary | ICD-10-CM | POA: Diagnosis not present

## 2013-07-10 DIAGNOSIS — I10 Essential (primary) hypertension: Secondary | ICD-10-CM | POA: Diagnosis not present

## 2013-07-10 DIAGNOSIS — E2749 Other adrenocortical insufficiency: Secondary | ICD-10-CM | POA: Diagnosis not present

## 2013-09-22 ENCOUNTER — Ambulatory Visit (INDEPENDENT_AMBULATORY_CARE_PROVIDER_SITE_OTHER): Payer: Medicare Other | Admitting: Urology

## 2013-09-22 ENCOUNTER — Encounter (INDEPENDENT_AMBULATORY_CARE_PROVIDER_SITE_OTHER): Payer: Self-pay

## 2013-09-22 DIAGNOSIS — R82998 Other abnormal findings in urine: Secondary | ICD-10-CM | POA: Diagnosis not present

## 2013-09-22 DIAGNOSIS — R32 Unspecified urinary incontinence: Secondary | ICD-10-CM

## 2013-09-22 DIAGNOSIS — R350 Frequency of micturition: Secondary | ICD-10-CM | POA: Diagnosis not present

## 2013-09-22 DIAGNOSIS — N318 Other neuromuscular dysfunction of bladder: Secondary | ICD-10-CM | POA: Diagnosis not present

## 2013-09-24 DIAGNOSIS — I70209 Unspecified atherosclerosis of native arteries of extremities, unspecified extremity: Secondary | ICD-10-CM | POA: Diagnosis not present

## 2013-10-06 DIAGNOSIS — I10 Essential (primary) hypertension: Secondary | ICD-10-CM | POA: Diagnosis not present

## 2013-10-08 DIAGNOSIS — R7309 Other abnormal glucose: Secondary | ICD-10-CM | POA: Diagnosis not present

## 2013-10-08 DIAGNOSIS — I10 Essential (primary) hypertension: Secondary | ICD-10-CM | POA: Diagnosis not present

## 2013-10-08 DIAGNOSIS — Q8501 Neurofibromatosis, type 1: Secondary | ICD-10-CM | POA: Diagnosis not present

## 2013-10-08 DIAGNOSIS — E559 Vitamin D deficiency, unspecified: Secondary | ICD-10-CM | POA: Diagnosis not present

## 2013-11-03 ENCOUNTER — Ambulatory Visit (INDEPENDENT_AMBULATORY_CARE_PROVIDER_SITE_OTHER): Payer: Medicare Other | Admitting: Urology

## 2013-11-03 DIAGNOSIS — R82998 Other abnormal findings in urine: Secondary | ICD-10-CM

## 2013-11-03 DIAGNOSIS — N318 Other neuromuscular dysfunction of bladder: Secondary | ICD-10-CM | POA: Diagnosis not present

## 2013-11-03 DIAGNOSIS — R32 Unspecified urinary incontinence: Secondary | ICD-10-CM

## 2013-11-03 DIAGNOSIS — R3915 Urgency of urination: Secondary | ICD-10-CM

## 2013-12-03 DIAGNOSIS — I70209 Unspecified atherosclerosis of native arteries of extremities, unspecified extremity: Secondary | ICD-10-CM | POA: Diagnosis not present

## 2014-01-27 DIAGNOSIS — G894 Chronic pain syndrome: Secondary | ICD-10-CM | POA: Diagnosis not present

## 2014-01-27 DIAGNOSIS — Z79899 Other long term (current) drug therapy: Secondary | ICD-10-CM | POA: Diagnosis not present

## 2014-01-27 DIAGNOSIS — M5137 Other intervertebral disc degeneration, lumbosacral region: Secondary | ICD-10-CM | POA: Diagnosis not present

## 2014-01-27 DIAGNOSIS — M2550 Pain in unspecified joint: Secondary | ICD-10-CM | POA: Diagnosis not present

## 2014-02-11 DIAGNOSIS — E1149 Type 2 diabetes mellitus with other diabetic neurological complication: Secondary | ICD-10-CM | POA: Diagnosis not present

## 2014-02-16 ENCOUNTER — Ambulatory Visit (INDEPENDENT_AMBULATORY_CARE_PROVIDER_SITE_OTHER): Payer: Medicare Other | Admitting: Urology

## 2014-02-16 DIAGNOSIS — N318 Other neuromuscular dysfunction of bladder: Secondary | ICD-10-CM | POA: Diagnosis not present

## 2014-02-16 DIAGNOSIS — R82998 Other abnormal findings in urine: Secondary | ICD-10-CM | POA: Diagnosis not present

## 2014-03-03 DIAGNOSIS — H353190 Nonexudative age-related macular degeneration, unspecified eye, stage unspecified: Secondary | ICD-10-CM | POA: Diagnosis not present

## 2014-03-03 DIAGNOSIS — H524 Presbyopia: Secondary | ICD-10-CM | POA: Diagnosis not present

## 2014-03-03 DIAGNOSIS — H5231 Anisometropia: Secondary | ICD-10-CM | POA: Diagnosis not present

## 2014-03-03 DIAGNOSIS — H52229 Regular astigmatism, unspecified eye: Secondary | ICD-10-CM | POA: Diagnosis not present

## 2014-03-04 ENCOUNTER — Other Ambulatory Visit: Payer: Self-pay | Admitting: Internal Medicine

## 2014-03-04 DIAGNOSIS — H9319 Tinnitus, unspecified ear: Secondary | ICD-10-CM

## 2014-03-04 DIAGNOSIS — R519 Headache, unspecified: Secondary | ICD-10-CM

## 2014-03-04 DIAGNOSIS — R51 Headache: Principal | ICD-10-CM

## 2014-03-04 DIAGNOSIS — Q8501 Neurofibromatosis, type 1: Secondary | ICD-10-CM | POA: Diagnosis not present

## 2014-03-09 ENCOUNTER — Other Ambulatory Visit: Payer: Medicare Other

## 2014-03-11 ENCOUNTER — Ambulatory Visit
Admission: RE | Admit: 2014-03-11 | Discharge: 2014-03-11 | Disposition: A | Discharge status: Home / Self Care | Payer: Medicare Other | Source: Ambulatory Visit | Attending: Internal Medicine | Admitting: Internal Medicine

## 2014-03-11 DIAGNOSIS — H9319 Tinnitus, unspecified ear: Secondary | ICD-10-CM | POA: Diagnosis not present

## 2014-03-11 DIAGNOSIS — R519 Headache, unspecified: Secondary | ICD-10-CM

## 2014-03-11 DIAGNOSIS — R51 Headache: Principal | ICD-10-CM

## 2014-03-11 MED ORDER — IOHEXOL 300 MG/ML  SOLN
75.0000 mL | Freq: Once | INTRAMUSCULAR | Status: AC | PRN
  Administered 2014-03-11: 75 mL via INTRAVENOUS

## 2014-03-15 DIAGNOSIS — H903 Sensorineural hearing loss, bilateral: Secondary | ICD-10-CM | POA: Diagnosis not present

## 2014-03-15 DIAGNOSIS — H9319 Tinnitus, unspecified ear: Secondary | ICD-10-CM | POA: Diagnosis not present

## 2014-03-17 DIAGNOSIS — IMO0002 Reserved for concepts with insufficient information to code with codable children: Secondary | ICD-10-CM | POA: Diagnosis not present

## 2014-03-17 DIAGNOSIS — M1710 Unilateral primary osteoarthritis, unspecified knee: Secondary | ICD-10-CM | POA: Diagnosis not present

## 2014-04-05 DIAGNOSIS — R5383 Other fatigue: Secondary | ICD-10-CM | POA: Diagnosis not present

## 2014-04-05 DIAGNOSIS — R7309 Other abnormal glucose: Secondary | ICD-10-CM | POA: Diagnosis not present

## 2014-04-05 DIAGNOSIS — R5381 Other malaise: Secondary | ICD-10-CM | POA: Diagnosis not present

## 2014-04-05 DIAGNOSIS — E559 Vitamin D deficiency, unspecified: Secondary | ICD-10-CM | POA: Diagnosis not present

## 2014-04-05 DIAGNOSIS — I10 Essential (primary) hypertension: Secondary | ICD-10-CM | POA: Diagnosis not present

## 2014-04-08 DIAGNOSIS — R51 Headache: Secondary | ICD-10-CM | POA: Diagnosis not present

## 2014-04-08 DIAGNOSIS — E559 Vitamin D deficiency, unspecified: Secondary | ICD-10-CM | POA: Diagnosis not present

## 2014-04-08 DIAGNOSIS — I10 Essential (primary) hypertension: Secondary | ICD-10-CM | POA: Diagnosis not present

## 2014-04-08 DIAGNOSIS — R7309 Other abnormal glucose: Secondary | ICD-10-CM | POA: Diagnosis not present

## 2014-04-14 DIAGNOSIS — IMO0002 Reserved for concepts with insufficient information to code with codable children: Secondary | ICD-10-CM | POA: Diagnosis not present

## 2014-04-14 DIAGNOSIS — M1710 Unilateral primary osteoarthritis, unspecified knee: Secondary | ICD-10-CM | POA: Diagnosis not present

## 2014-04-22 DIAGNOSIS — I70209 Unspecified atherosclerosis of native arteries of extremities, unspecified extremity: Secondary | ICD-10-CM | POA: Diagnosis not present

## 2014-06-15 DIAGNOSIS — M542 Cervicalgia: Secondary | ICD-10-CM | POA: Diagnosis not present

## 2014-07-05 DIAGNOSIS — I739 Peripheral vascular disease, unspecified: Secondary | ICD-10-CM | POA: Diagnosis not present

## 2014-09-20 DIAGNOSIS — I739 Peripheral vascular disease, unspecified: Secondary | ICD-10-CM | POA: Diagnosis not present

## 2014-10-08 DIAGNOSIS — R7309 Other abnormal glucose: Secondary | ICD-10-CM | POA: Diagnosis not present

## 2014-10-08 DIAGNOSIS — N390 Urinary tract infection, site not specified: Secondary | ICD-10-CM | POA: Diagnosis not present

## 2014-10-08 DIAGNOSIS — E559 Vitamin D deficiency, unspecified: Secondary | ICD-10-CM | POA: Diagnosis not present

## 2014-10-08 DIAGNOSIS — M818 Other osteoporosis without current pathological fracture: Secondary | ICD-10-CM | POA: Diagnosis not present

## 2014-10-08 DIAGNOSIS — I10 Essential (primary) hypertension: Secondary | ICD-10-CM | POA: Diagnosis not present

## 2014-10-12 DIAGNOSIS — I10 Essential (primary) hypertension: Secondary | ICD-10-CM | POA: Diagnosis not present

## 2014-10-12 DIAGNOSIS — R5383 Other fatigue: Secondary | ICD-10-CM | POA: Diagnosis not present

## 2014-10-12 DIAGNOSIS — M1990 Unspecified osteoarthritis, unspecified site: Secondary | ICD-10-CM | POA: Diagnosis not present

## 2014-10-12 DIAGNOSIS — M542 Cervicalgia: Secondary | ICD-10-CM | POA: Diagnosis not present

## 2014-10-13 DIAGNOSIS — M1710 Unilateral primary osteoarthritis, unspecified knee: Secondary | ICD-10-CM | POA: Diagnosis not present

## 2014-11-29 DIAGNOSIS — I739 Peripheral vascular disease, unspecified: Secondary | ICD-10-CM | POA: Diagnosis not present

## 2015-02-07 DIAGNOSIS — I739 Peripheral vascular disease, unspecified: Secondary | ICD-10-CM | POA: Diagnosis not present

## 2015-03-30 DIAGNOSIS — W19XXXA Unspecified fall, initial encounter: Secondary | ICD-10-CM | POA: Diagnosis not present

## 2015-03-30 DIAGNOSIS — R0781 Pleurodynia: Secondary | ICD-10-CM | POA: Diagnosis not present

## 2015-03-30 DIAGNOSIS — S299XXA Unspecified injury of thorax, initial encounter: Secondary | ICD-10-CM | POA: Diagnosis not present

## 2015-04-04 DIAGNOSIS — R5383 Other fatigue: Secondary | ICD-10-CM | POA: Diagnosis not present

## 2015-04-04 DIAGNOSIS — E559 Vitamin D deficiency, unspecified: Secondary | ICD-10-CM | POA: Diagnosis not present

## 2015-04-04 DIAGNOSIS — I10 Essential (primary) hypertension: Secondary | ICD-10-CM | POA: Diagnosis not present

## 2015-04-13 DIAGNOSIS — N390 Urinary tract infection, site not specified: Secondary | ICD-10-CM | POA: Diagnosis not present

## 2015-04-13 DIAGNOSIS — I10 Essential (primary) hypertension: Secondary | ICD-10-CM | POA: Diagnosis not present

## 2015-04-13 DIAGNOSIS — E559 Vitamin D deficiency, unspecified: Secondary | ICD-10-CM | POA: Diagnosis not present

## 2015-04-13 DIAGNOSIS — Q8501 Neurofibromatosis, type 1: Secondary | ICD-10-CM | POA: Diagnosis not present

## 2015-04-13 DIAGNOSIS — R739 Hyperglycemia, unspecified: Secondary | ICD-10-CM | POA: Diagnosis not present

## 2015-04-20 DIAGNOSIS — H3530 Unspecified macular degeneration: Secondary | ICD-10-CM | POA: Diagnosis not present

## 2015-04-20 DIAGNOSIS — H52223 Regular astigmatism, bilateral: Secondary | ICD-10-CM | POA: Diagnosis not present

## 2015-04-20 DIAGNOSIS — H524 Presbyopia: Secondary | ICD-10-CM | POA: Diagnosis not present

## 2015-04-20 DIAGNOSIS — H5203 Hypermetropia, bilateral: Secondary | ICD-10-CM | POA: Diagnosis not present

## 2015-04-25 DIAGNOSIS — I739 Peripheral vascular disease, unspecified: Secondary | ICD-10-CM | POA: Diagnosis not present

## 2015-04-26 DIAGNOSIS — J449 Chronic obstructive pulmonary disease, unspecified: Secondary | ICD-10-CM | POA: Diagnosis not present

## 2015-04-26 DIAGNOSIS — R112 Nausea with vomiting, unspecified: Secondary | ICD-10-CM | POA: Diagnosis not present

## 2015-05-02 DIAGNOSIS — R110 Nausea: Secondary | ICD-10-CM | POA: Diagnosis not present

## 2015-05-02 DIAGNOSIS — N390 Urinary tract infection, site not specified: Secondary | ICD-10-CM | POA: Diagnosis not present

## 2015-05-02 DIAGNOSIS — K5900 Constipation, unspecified: Secondary | ICD-10-CM | POA: Diagnosis not present

## 2015-05-09 DIAGNOSIS — L89892 Pressure ulcer of other site, stage 2: Secondary | ICD-10-CM | POA: Diagnosis not present

## 2015-05-09 DIAGNOSIS — M79672 Pain in left foot: Secondary | ICD-10-CM | POA: Diagnosis not present

## 2015-05-24 DIAGNOSIS — I10 Essential (primary) hypertension: Secondary | ICD-10-CM | POA: Diagnosis not present

## 2015-05-27 DIAGNOSIS — E875 Hyperkalemia: Secondary | ICD-10-CM | POA: Diagnosis not present

## 2015-05-27 DIAGNOSIS — I10 Essential (primary) hypertension: Secondary | ICD-10-CM | POA: Diagnosis not present

## 2015-05-27 DIAGNOSIS — K5909 Other constipation: Secondary | ICD-10-CM | POA: Diagnosis not present

## 2015-05-27 DIAGNOSIS — M25562 Pain in left knee: Secondary | ICD-10-CM | POA: Diagnosis not present

## 2015-06-24 DIAGNOSIS — M25561 Pain in right knee: Secondary | ICD-10-CM | POA: Diagnosis not present

## 2015-06-24 DIAGNOSIS — M25562 Pain in left knee: Secondary | ICD-10-CM | POA: Diagnosis not present

## 2015-06-24 DIAGNOSIS — R2681 Unsteadiness on feet: Secondary | ICD-10-CM | POA: Diagnosis not present

## 2015-06-24 DIAGNOSIS — R262 Difficulty in walking, not elsewhere classified: Secondary | ICD-10-CM | POA: Diagnosis not present

## 2015-06-24 DIAGNOSIS — M170 Bilateral primary osteoarthritis of knee: Secondary | ICD-10-CM | POA: Diagnosis not present

## 2015-06-29 DIAGNOSIS — M25562 Pain in left knee: Secondary | ICD-10-CM | POA: Diagnosis not present

## 2015-06-29 DIAGNOSIS — M170 Bilateral primary osteoarthritis of knee: Secondary | ICD-10-CM | POA: Diagnosis not present

## 2015-06-29 DIAGNOSIS — M1711 Unilateral primary osteoarthritis, right knee: Secondary | ICD-10-CM | POA: Diagnosis not present

## 2015-06-29 DIAGNOSIS — R2689 Other abnormalities of gait and mobility: Secondary | ICD-10-CM | POA: Diagnosis not present

## 2015-06-29 DIAGNOSIS — M25561 Pain in right knee: Secondary | ICD-10-CM | POA: Diagnosis not present

## 2015-06-30 DIAGNOSIS — M25561 Pain in right knee: Secondary | ICD-10-CM | POA: Diagnosis not present

## 2015-06-30 DIAGNOSIS — M25562 Pain in left knee: Secondary | ICD-10-CM | POA: Diagnosis not present

## 2015-06-30 DIAGNOSIS — M1712 Unilateral primary osteoarthritis, left knee: Secondary | ICD-10-CM | POA: Diagnosis not present

## 2015-06-30 DIAGNOSIS — R2681 Unsteadiness on feet: Secondary | ICD-10-CM | POA: Diagnosis not present

## 2015-06-30 DIAGNOSIS — M170 Bilateral primary osteoarthritis of knee: Secondary | ICD-10-CM | POA: Diagnosis not present

## 2015-07-04 DIAGNOSIS — M170 Bilateral primary osteoarthritis of knee: Secondary | ICD-10-CM | POA: Diagnosis not present

## 2015-07-04 DIAGNOSIS — M25562 Pain in left knee: Secondary | ICD-10-CM | POA: Diagnosis not present

## 2015-07-04 DIAGNOSIS — M25561 Pain in right knee: Secondary | ICD-10-CM | POA: Diagnosis not present

## 2015-07-04 DIAGNOSIS — R2681 Unsteadiness on feet: Secondary | ICD-10-CM | POA: Diagnosis not present

## 2015-07-04 DIAGNOSIS — M1711 Unilateral primary osteoarthritis, right knee: Secondary | ICD-10-CM | POA: Diagnosis not present

## 2015-07-06 DIAGNOSIS — R2681 Unsteadiness on feet: Secondary | ICD-10-CM | POA: Diagnosis not present

## 2015-07-06 DIAGNOSIS — M25562 Pain in left knee: Secondary | ICD-10-CM | POA: Diagnosis not present

## 2015-07-06 DIAGNOSIS — M170 Bilateral primary osteoarthritis of knee: Secondary | ICD-10-CM | POA: Diagnosis not present

## 2015-07-06 DIAGNOSIS — M25561 Pain in right knee: Secondary | ICD-10-CM | POA: Diagnosis not present

## 2015-07-06 DIAGNOSIS — M1712 Unilateral primary osteoarthritis, left knee: Secondary | ICD-10-CM | POA: Diagnosis not present

## 2015-07-11 DIAGNOSIS — I739 Peripheral vascular disease, unspecified: Secondary | ICD-10-CM | POA: Diagnosis not present

## 2015-07-12 DIAGNOSIS — M25562 Pain in left knee: Secondary | ICD-10-CM | POA: Diagnosis not present

## 2015-07-12 DIAGNOSIS — M170 Bilateral primary osteoarthritis of knee: Secondary | ICD-10-CM | POA: Diagnosis not present

## 2015-07-12 DIAGNOSIS — M25561 Pain in right knee: Secondary | ICD-10-CM | POA: Diagnosis not present

## 2015-07-12 DIAGNOSIS — R2681 Unsteadiness on feet: Secondary | ICD-10-CM | POA: Diagnosis not present

## 2015-07-12 DIAGNOSIS — M1711 Unilateral primary osteoarthritis, right knee: Secondary | ICD-10-CM | POA: Diagnosis not present

## 2015-07-14 DIAGNOSIS — M25561 Pain in right knee: Secondary | ICD-10-CM | POA: Diagnosis not present

## 2015-07-14 DIAGNOSIS — M170 Bilateral primary osteoarthritis of knee: Secondary | ICD-10-CM | POA: Diagnosis not present

## 2015-07-14 DIAGNOSIS — R2681 Unsteadiness on feet: Secondary | ICD-10-CM | POA: Diagnosis not present

## 2015-07-14 DIAGNOSIS — M25562 Pain in left knee: Secondary | ICD-10-CM | POA: Diagnosis not present

## 2015-07-14 DIAGNOSIS — M1712 Unilateral primary osteoarthritis, left knee: Secondary | ICD-10-CM | POA: Diagnosis not present

## 2015-07-22 DIAGNOSIS — M1711 Unilateral primary osteoarthritis, right knee: Secondary | ICD-10-CM | POA: Diagnosis not present

## 2015-07-22 DIAGNOSIS — M25561 Pain in right knee: Secondary | ICD-10-CM | POA: Diagnosis not present

## 2015-07-22 DIAGNOSIS — M170 Bilateral primary osteoarthritis of knee: Secondary | ICD-10-CM | POA: Diagnosis not present

## 2015-07-22 DIAGNOSIS — M25562 Pain in left knee: Secondary | ICD-10-CM | POA: Diagnosis not present

## 2015-07-27 DIAGNOSIS — R2681 Unsteadiness on feet: Secondary | ICD-10-CM | POA: Diagnosis not present

## 2015-07-27 DIAGNOSIS — M170 Bilateral primary osteoarthritis of knee: Secondary | ICD-10-CM | POA: Diagnosis not present

## 2015-07-27 DIAGNOSIS — M25561 Pain in right knee: Secondary | ICD-10-CM | POA: Diagnosis not present

## 2015-07-27 DIAGNOSIS — M25562 Pain in left knee: Secondary | ICD-10-CM | POA: Diagnosis not present

## 2015-07-27 DIAGNOSIS — M1712 Unilateral primary osteoarthritis, left knee: Secondary | ICD-10-CM | POA: Diagnosis not present

## 2015-08-24 DIAGNOSIS — Z23 Encounter for immunization: Secondary | ICD-10-CM | POA: Diagnosis not present

## 2015-08-29 DIAGNOSIS — M170 Bilateral primary osteoarthritis of knee: Secondary | ICD-10-CM | POA: Diagnosis not present

## 2015-08-29 DIAGNOSIS — M25562 Pain in left knee: Secondary | ICD-10-CM | POA: Diagnosis not present

## 2015-08-29 DIAGNOSIS — R262 Difficulty in walking, not elsewhere classified: Secondary | ICD-10-CM | POA: Diagnosis not present

## 2015-08-29 DIAGNOSIS — M25561 Pain in right knee: Secondary | ICD-10-CM | POA: Diagnosis not present

## 2015-09-22 DIAGNOSIS — E559 Vitamin D deficiency, unspecified: Secondary | ICD-10-CM | POA: Diagnosis not present

## 2015-09-22 DIAGNOSIS — I10 Essential (primary) hypertension: Secondary | ICD-10-CM | POA: Diagnosis not present

## 2015-09-26 DIAGNOSIS — I739 Peripheral vascular disease, unspecified: Secondary | ICD-10-CM | POA: Diagnosis not present

## 2015-09-27 DIAGNOSIS — M25561 Pain in right knee: Secondary | ICD-10-CM | POA: Diagnosis not present

## 2015-09-27 DIAGNOSIS — M25562 Pain in left knee: Secondary | ICD-10-CM | POA: Diagnosis not present

## 2015-09-27 DIAGNOSIS — I10 Essential (primary) hypertension: Secondary | ICD-10-CM | POA: Diagnosis not present

## 2015-09-27 DIAGNOSIS — E559 Vitamin D deficiency, unspecified: Secondary | ICD-10-CM | POA: Diagnosis not present

## 2015-09-29 DIAGNOSIS — J45909 Unspecified asthma, uncomplicated: Secondary | ICD-10-CM | POA: Diagnosis not present

## 2015-09-29 DIAGNOSIS — I10 Essential (primary) hypertension: Secondary | ICD-10-CM | POA: Diagnosis not present

## 2015-09-29 DIAGNOSIS — Q8501 Neurofibromatosis, type 1: Secondary | ICD-10-CM | POA: Diagnosis not present

## 2015-09-29 DIAGNOSIS — M170 Bilateral primary osteoarthritis of knee: Secondary | ICD-10-CM | POA: Diagnosis not present

## 2015-09-29 DIAGNOSIS — S80812D Abrasion, left lower leg, subsequent encounter: Secondary | ICD-10-CM | POA: Diagnosis not present

## 2015-09-29 DIAGNOSIS — M4806 Spinal stenosis, lumbar region: Secondary | ICD-10-CM | POA: Diagnosis not present

## 2015-10-04 DIAGNOSIS — J45909 Unspecified asthma, uncomplicated: Secondary | ICD-10-CM | POA: Diagnosis not present

## 2015-10-04 DIAGNOSIS — Q8501 Neurofibromatosis, type 1: Secondary | ICD-10-CM | POA: Diagnosis not present

## 2015-10-04 DIAGNOSIS — M170 Bilateral primary osteoarthritis of knee: Secondary | ICD-10-CM | POA: Diagnosis not present

## 2015-10-04 DIAGNOSIS — M4806 Spinal stenosis, lumbar region: Secondary | ICD-10-CM | POA: Diagnosis not present

## 2015-10-04 DIAGNOSIS — I10 Essential (primary) hypertension: Secondary | ICD-10-CM | POA: Diagnosis not present

## 2015-10-04 DIAGNOSIS — S80812D Abrasion, left lower leg, subsequent encounter: Secondary | ICD-10-CM | POA: Diagnosis not present

## 2015-10-06 DIAGNOSIS — Q8501 Neurofibromatosis, type 1: Secondary | ICD-10-CM | POA: Diagnosis not present

## 2015-10-06 DIAGNOSIS — M4806 Spinal stenosis, lumbar region: Secondary | ICD-10-CM | POA: Diagnosis not present

## 2015-10-06 DIAGNOSIS — S80812D Abrasion, left lower leg, subsequent encounter: Secondary | ICD-10-CM | POA: Diagnosis not present

## 2015-10-06 DIAGNOSIS — I10 Essential (primary) hypertension: Secondary | ICD-10-CM | POA: Diagnosis not present

## 2015-10-06 DIAGNOSIS — M170 Bilateral primary osteoarthritis of knee: Secondary | ICD-10-CM | POA: Diagnosis not present

## 2015-10-06 DIAGNOSIS — J45909 Unspecified asthma, uncomplicated: Secondary | ICD-10-CM | POA: Diagnosis not present

## 2015-10-10 DIAGNOSIS — I10 Essential (primary) hypertension: Secondary | ICD-10-CM | POA: Diagnosis not present

## 2015-10-10 DIAGNOSIS — S80812D Abrasion, left lower leg, subsequent encounter: Secondary | ICD-10-CM | POA: Diagnosis not present

## 2015-10-10 DIAGNOSIS — M4806 Spinal stenosis, lumbar region: Secondary | ICD-10-CM | POA: Diagnosis not present

## 2015-10-10 DIAGNOSIS — M170 Bilateral primary osteoarthritis of knee: Secondary | ICD-10-CM | POA: Diagnosis not present

## 2015-10-10 DIAGNOSIS — J45909 Unspecified asthma, uncomplicated: Secondary | ICD-10-CM | POA: Diagnosis not present

## 2015-10-10 DIAGNOSIS — Q8501 Neurofibromatosis, type 1: Secondary | ICD-10-CM | POA: Diagnosis not present

## 2015-10-12 DIAGNOSIS — J45909 Unspecified asthma, uncomplicated: Secondary | ICD-10-CM | POA: Diagnosis not present

## 2015-10-12 DIAGNOSIS — S80812D Abrasion, left lower leg, subsequent encounter: Secondary | ICD-10-CM | POA: Diagnosis not present

## 2015-10-12 DIAGNOSIS — I10 Essential (primary) hypertension: Secondary | ICD-10-CM | POA: Diagnosis not present

## 2015-10-12 DIAGNOSIS — Q8501 Neurofibromatosis, type 1: Secondary | ICD-10-CM | POA: Diagnosis not present

## 2015-10-12 DIAGNOSIS — M4806 Spinal stenosis, lumbar region: Secondary | ICD-10-CM | POA: Diagnosis not present

## 2015-10-12 DIAGNOSIS — M170 Bilateral primary osteoarthritis of knee: Secondary | ICD-10-CM | POA: Diagnosis not present

## 2015-10-18 DIAGNOSIS — S80812D Abrasion, left lower leg, subsequent encounter: Secondary | ICD-10-CM | POA: Diagnosis not present

## 2015-10-18 DIAGNOSIS — Q8501 Neurofibromatosis, type 1: Secondary | ICD-10-CM | POA: Diagnosis not present

## 2015-10-18 DIAGNOSIS — J45909 Unspecified asthma, uncomplicated: Secondary | ICD-10-CM | POA: Diagnosis not present

## 2015-10-18 DIAGNOSIS — M4806 Spinal stenosis, lumbar region: Secondary | ICD-10-CM | POA: Diagnosis not present

## 2015-10-18 DIAGNOSIS — I10 Essential (primary) hypertension: Secondary | ICD-10-CM | POA: Diagnosis not present

## 2015-10-18 DIAGNOSIS — M170 Bilateral primary osteoarthritis of knee: Secondary | ICD-10-CM | POA: Diagnosis not present

## 2015-10-20 DIAGNOSIS — J45909 Unspecified asthma, uncomplicated: Secondary | ICD-10-CM | POA: Diagnosis not present

## 2015-10-20 DIAGNOSIS — N3281 Overactive bladder: Secondary | ICD-10-CM | POA: Diagnosis not present

## 2015-10-20 DIAGNOSIS — Q8501 Neurofibromatosis, type 1: Secondary | ICD-10-CM | POA: Diagnosis not present

## 2015-10-20 DIAGNOSIS — M170 Bilateral primary osteoarthritis of knee: Secondary | ICD-10-CM | POA: Diagnosis not present

## 2015-10-20 DIAGNOSIS — G8929 Other chronic pain: Secondary | ICD-10-CM | POA: Diagnosis not present

## 2015-10-20 DIAGNOSIS — I10 Essential (primary) hypertension: Secondary | ICD-10-CM | POA: Diagnosis not present

## 2015-10-20 DIAGNOSIS — M4806 Spinal stenosis, lumbar region: Secondary | ICD-10-CM | POA: Diagnosis not present

## 2015-10-20 DIAGNOSIS — S80812D Abrasion, left lower leg, subsequent encounter: Secondary | ICD-10-CM | POA: Diagnosis not present

## 2015-10-20 DIAGNOSIS — M25562 Pain in left knee: Secondary | ICD-10-CM | POA: Diagnosis not present

## 2015-10-20 DIAGNOSIS — M25561 Pain in right knee: Secondary | ICD-10-CM | POA: Diagnosis not present

## 2015-10-25 DIAGNOSIS — I10 Essential (primary) hypertension: Secondary | ICD-10-CM | POA: Diagnosis not present

## 2015-10-25 DIAGNOSIS — M170 Bilateral primary osteoarthritis of knee: Secondary | ICD-10-CM | POA: Diagnosis not present

## 2015-10-25 DIAGNOSIS — S80812D Abrasion, left lower leg, subsequent encounter: Secondary | ICD-10-CM | POA: Diagnosis not present

## 2015-10-25 DIAGNOSIS — Q8501 Neurofibromatosis, type 1: Secondary | ICD-10-CM | POA: Diagnosis not present

## 2015-10-25 DIAGNOSIS — J45909 Unspecified asthma, uncomplicated: Secondary | ICD-10-CM | POA: Diagnosis not present

## 2015-10-25 DIAGNOSIS — M4806 Spinal stenosis, lumbar region: Secondary | ICD-10-CM | POA: Diagnosis not present

## 2015-10-27 DIAGNOSIS — S80812D Abrasion, left lower leg, subsequent encounter: Secondary | ICD-10-CM | POA: Diagnosis not present

## 2015-10-27 DIAGNOSIS — J45909 Unspecified asthma, uncomplicated: Secondary | ICD-10-CM | POA: Diagnosis not present

## 2015-10-27 DIAGNOSIS — M170 Bilateral primary osteoarthritis of knee: Secondary | ICD-10-CM | POA: Diagnosis not present

## 2015-10-27 DIAGNOSIS — I10 Essential (primary) hypertension: Secondary | ICD-10-CM | POA: Diagnosis not present

## 2015-10-27 DIAGNOSIS — Q8501 Neurofibromatosis, type 1: Secondary | ICD-10-CM | POA: Diagnosis not present

## 2015-10-27 DIAGNOSIS — M4806 Spinal stenosis, lumbar region: Secondary | ICD-10-CM | POA: Diagnosis not present

## 2015-11-01 DIAGNOSIS — S80812D Abrasion, left lower leg, subsequent encounter: Secondary | ICD-10-CM | POA: Diagnosis not present

## 2015-11-01 DIAGNOSIS — Q8501 Neurofibromatosis, type 1: Secondary | ICD-10-CM | POA: Diagnosis not present

## 2015-11-01 DIAGNOSIS — J45909 Unspecified asthma, uncomplicated: Secondary | ICD-10-CM | POA: Diagnosis not present

## 2015-11-01 DIAGNOSIS — I10 Essential (primary) hypertension: Secondary | ICD-10-CM | POA: Diagnosis not present

## 2015-11-01 DIAGNOSIS — M4806 Spinal stenosis, lumbar region: Secondary | ICD-10-CM | POA: Diagnosis not present

## 2015-11-01 DIAGNOSIS — M170 Bilateral primary osteoarthritis of knee: Secondary | ICD-10-CM | POA: Diagnosis not present

## 2015-11-02 DIAGNOSIS — M25561 Pain in right knee: Secondary | ICD-10-CM | POA: Diagnosis not present

## 2015-11-02 DIAGNOSIS — M25562 Pain in left knee: Secondary | ICD-10-CM | POA: Diagnosis not present

## 2015-11-02 DIAGNOSIS — M170 Bilateral primary osteoarthritis of knee: Secondary | ICD-10-CM | POA: Diagnosis not present

## 2015-11-03 DIAGNOSIS — I10 Essential (primary) hypertension: Secondary | ICD-10-CM | POA: Diagnosis not present

## 2015-11-03 DIAGNOSIS — S80812D Abrasion, left lower leg, subsequent encounter: Secondary | ICD-10-CM | POA: Diagnosis not present

## 2015-11-03 DIAGNOSIS — M170 Bilateral primary osteoarthritis of knee: Secondary | ICD-10-CM | POA: Diagnosis not present

## 2015-11-03 DIAGNOSIS — J45909 Unspecified asthma, uncomplicated: Secondary | ICD-10-CM | POA: Diagnosis not present

## 2015-11-03 DIAGNOSIS — Q8501 Neurofibromatosis, type 1: Secondary | ICD-10-CM | POA: Diagnosis not present

## 2015-11-03 DIAGNOSIS — M4806 Spinal stenosis, lumbar region: Secondary | ICD-10-CM | POA: Diagnosis not present

## 2015-11-08 DIAGNOSIS — S80812D Abrasion, left lower leg, subsequent encounter: Secondary | ICD-10-CM | POA: Diagnosis not present

## 2015-11-08 DIAGNOSIS — M170 Bilateral primary osteoarthritis of knee: Secondary | ICD-10-CM | POA: Diagnosis not present

## 2015-11-08 DIAGNOSIS — M4806 Spinal stenosis, lumbar region: Secondary | ICD-10-CM | POA: Diagnosis not present

## 2015-11-08 DIAGNOSIS — J45909 Unspecified asthma, uncomplicated: Secondary | ICD-10-CM | POA: Diagnosis not present

## 2015-11-08 DIAGNOSIS — I10 Essential (primary) hypertension: Secondary | ICD-10-CM | POA: Diagnosis not present

## 2015-11-08 DIAGNOSIS — Q8501 Neurofibromatosis, type 1: Secondary | ICD-10-CM | POA: Diagnosis not present

## 2015-11-10 DIAGNOSIS — M4806 Spinal stenosis, lumbar region: Secondary | ICD-10-CM | POA: Diagnosis not present

## 2015-11-10 DIAGNOSIS — J45909 Unspecified asthma, uncomplicated: Secondary | ICD-10-CM | POA: Diagnosis not present

## 2015-11-10 DIAGNOSIS — S80812D Abrasion, left lower leg, subsequent encounter: Secondary | ICD-10-CM | POA: Diagnosis not present

## 2015-11-10 DIAGNOSIS — I10 Essential (primary) hypertension: Secondary | ICD-10-CM | POA: Diagnosis not present

## 2015-11-10 DIAGNOSIS — Q8501 Neurofibromatosis, type 1: Secondary | ICD-10-CM | POA: Diagnosis not present

## 2015-11-10 DIAGNOSIS — M170 Bilateral primary osteoarthritis of knee: Secondary | ICD-10-CM | POA: Diagnosis not present

## 2015-11-15 DIAGNOSIS — M25561 Pain in right knee: Secondary | ICD-10-CM | POA: Diagnosis not present

## 2015-11-15 DIAGNOSIS — M25562 Pain in left knee: Secondary | ICD-10-CM | POA: Diagnosis not present

## 2015-11-15 DIAGNOSIS — M170 Bilateral primary osteoarthritis of knee: Secondary | ICD-10-CM | POA: Diagnosis not present

## 2015-11-16 DIAGNOSIS — M170 Bilateral primary osteoarthritis of knee: Secondary | ICD-10-CM | POA: Diagnosis not present

## 2015-11-16 DIAGNOSIS — S80812D Abrasion, left lower leg, subsequent encounter: Secondary | ICD-10-CM | POA: Diagnosis not present

## 2015-11-16 DIAGNOSIS — Q8501 Neurofibromatosis, type 1: Secondary | ICD-10-CM | POA: Diagnosis not present

## 2015-11-16 DIAGNOSIS — I10 Essential (primary) hypertension: Secondary | ICD-10-CM | POA: Diagnosis not present

## 2015-11-16 DIAGNOSIS — J45909 Unspecified asthma, uncomplicated: Secondary | ICD-10-CM | POA: Diagnosis not present

## 2015-11-16 DIAGNOSIS — M4806 Spinal stenosis, lumbar region: Secondary | ICD-10-CM | POA: Diagnosis not present

## 2015-11-18 DIAGNOSIS — Q8501 Neurofibromatosis, type 1: Secondary | ICD-10-CM | POA: Diagnosis not present

## 2015-11-18 DIAGNOSIS — M4806 Spinal stenosis, lumbar region: Secondary | ICD-10-CM | POA: Diagnosis not present

## 2015-11-18 DIAGNOSIS — J45909 Unspecified asthma, uncomplicated: Secondary | ICD-10-CM | POA: Diagnosis not present

## 2015-11-18 DIAGNOSIS — I10 Essential (primary) hypertension: Secondary | ICD-10-CM | POA: Diagnosis not present

## 2015-11-18 DIAGNOSIS — S80812D Abrasion, left lower leg, subsequent encounter: Secondary | ICD-10-CM | POA: Diagnosis not present

## 2015-11-18 DIAGNOSIS — M170 Bilateral primary osteoarthritis of knee: Secondary | ICD-10-CM | POA: Diagnosis not present

## 2015-11-22 DIAGNOSIS — Q8501 Neurofibromatosis, type 1: Secondary | ICD-10-CM | POA: Diagnosis not present

## 2015-11-22 DIAGNOSIS — J45909 Unspecified asthma, uncomplicated: Secondary | ICD-10-CM | POA: Diagnosis not present

## 2015-11-22 DIAGNOSIS — M4806 Spinal stenosis, lumbar region: Secondary | ICD-10-CM | POA: Diagnosis not present

## 2015-11-22 DIAGNOSIS — M170 Bilateral primary osteoarthritis of knee: Secondary | ICD-10-CM | POA: Diagnosis not present

## 2015-11-22 DIAGNOSIS — S80812D Abrasion, left lower leg, subsequent encounter: Secondary | ICD-10-CM | POA: Diagnosis not present

## 2015-11-22 DIAGNOSIS — I10 Essential (primary) hypertension: Secondary | ICD-10-CM | POA: Diagnosis not present

## 2015-11-24 DIAGNOSIS — J45909 Unspecified asthma, uncomplicated: Secondary | ICD-10-CM | POA: Diagnosis not present

## 2015-11-24 DIAGNOSIS — M170 Bilateral primary osteoarthritis of knee: Secondary | ICD-10-CM | POA: Diagnosis not present

## 2015-11-24 DIAGNOSIS — I10 Essential (primary) hypertension: Secondary | ICD-10-CM | POA: Diagnosis not present

## 2015-11-24 DIAGNOSIS — Q8501 Neurofibromatosis, type 1: Secondary | ICD-10-CM | POA: Diagnosis not present

## 2015-11-24 DIAGNOSIS — M4806 Spinal stenosis, lumbar region: Secondary | ICD-10-CM | POA: Diagnosis not present

## 2015-11-24 DIAGNOSIS — S80812D Abrasion, left lower leg, subsequent encounter: Secondary | ICD-10-CM | POA: Diagnosis not present

## 2015-12-05 DIAGNOSIS — I739 Peripheral vascular disease, unspecified: Secondary | ICD-10-CM | POA: Diagnosis not present

## 2015-12-07 DIAGNOSIS — R262 Difficulty in walking, not elsewhere classified: Secondary | ICD-10-CM | POA: Diagnosis not present

## 2015-12-07 DIAGNOSIS — M25562 Pain in left knee: Secondary | ICD-10-CM | POA: Diagnosis not present

## 2015-12-07 DIAGNOSIS — M170 Bilateral primary osteoarthritis of knee: Secondary | ICD-10-CM | POA: Diagnosis not present

## 2015-12-07 DIAGNOSIS — M25561 Pain in right knee: Secondary | ICD-10-CM | POA: Diagnosis not present

## 2015-12-13 DIAGNOSIS — M25561 Pain in right knee: Secondary | ICD-10-CM | POA: Diagnosis not present

## 2015-12-13 DIAGNOSIS — M1712 Unilateral primary osteoarthritis, left knee: Secondary | ICD-10-CM | POA: Diagnosis not present

## 2015-12-13 DIAGNOSIS — M25562 Pain in left knee: Secondary | ICD-10-CM | POA: Diagnosis not present

## 2015-12-13 DIAGNOSIS — M170 Bilateral primary osteoarthritis of knee: Secondary | ICD-10-CM | POA: Diagnosis not present

## 2015-12-13 DIAGNOSIS — R2689 Other abnormalities of gait and mobility: Secondary | ICD-10-CM | POA: Diagnosis not present

## 2015-12-15 DIAGNOSIS — M1711 Unilateral primary osteoarthritis, right knee: Secondary | ICD-10-CM | POA: Diagnosis not present

## 2015-12-15 DIAGNOSIS — M25561 Pain in right knee: Secondary | ICD-10-CM | POA: Diagnosis not present

## 2015-12-20 DIAGNOSIS — M170 Bilateral primary osteoarthritis of knee: Secondary | ICD-10-CM | POA: Diagnosis not present

## 2015-12-20 DIAGNOSIS — M1712 Unilateral primary osteoarthritis, left knee: Secondary | ICD-10-CM | POA: Diagnosis not present

## 2015-12-20 DIAGNOSIS — R2689 Other abnormalities of gait and mobility: Secondary | ICD-10-CM | POA: Diagnosis not present

## 2015-12-20 DIAGNOSIS — M25562 Pain in left knee: Secondary | ICD-10-CM | POA: Diagnosis not present

## 2015-12-20 DIAGNOSIS — M25561 Pain in right knee: Secondary | ICD-10-CM | POA: Diagnosis not present

## 2015-12-28 DIAGNOSIS — M25562 Pain in left knee: Secondary | ICD-10-CM | POA: Diagnosis not present

## 2015-12-28 DIAGNOSIS — M25561 Pain in right knee: Secondary | ICD-10-CM | POA: Diagnosis not present

## 2015-12-28 DIAGNOSIS — M170 Bilateral primary osteoarthritis of knee: Secondary | ICD-10-CM | POA: Diagnosis not present

## 2015-12-28 DIAGNOSIS — M1711 Unilateral primary osteoarthritis, right knee: Secondary | ICD-10-CM | POA: Diagnosis not present

## 2015-12-28 DIAGNOSIS — R2689 Other abnormalities of gait and mobility: Secondary | ICD-10-CM | POA: Diagnosis not present

## 2016-01-03 DIAGNOSIS — M170 Bilateral primary osteoarthritis of knee: Secondary | ICD-10-CM | POA: Diagnosis not present

## 2016-01-03 DIAGNOSIS — M1712 Unilateral primary osteoarthritis, left knee: Secondary | ICD-10-CM | POA: Diagnosis not present

## 2016-01-03 DIAGNOSIS — R2689 Other abnormalities of gait and mobility: Secondary | ICD-10-CM | POA: Diagnosis not present

## 2016-01-03 DIAGNOSIS — M25561 Pain in right knee: Secondary | ICD-10-CM | POA: Diagnosis not present

## 2016-01-03 DIAGNOSIS — M25562 Pain in left knee: Secondary | ICD-10-CM | POA: Diagnosis not present

## 2016-01-10 DIAGNOSIS — M1711 Unilateral primary osteoarthritis, right knee: Secondary | ICD-10-CM | POA: Diagnosis not present

## 2016-01-10 DIAGNOSIS — M170 Bilateral primary osteoarthritis of knee: Secondary | ICD-10-CM | POA: Diagnosis not present

## 2016-01-10 DIAGNOSIS — M25562 Pain in left knee: Secondary | ICD-10-CM | POA: Diagnosis not present

## 2016-01-10 DIAGNOSIS — R2689 Other abnormalities of gait and mobility: Secondary | ICD-10-CM | POA: Diagnosis not present

## 2016-01-10 DIAGNOSIS — M25561 Pain in right knee: Secondary | ICD-10-CM | POA: Diagnosis not present

## 2016-01-12 DIAGNOSIS — M25561 Pain in right knee: Secondary | ICD-10-CM | POA: Diagnosis not present

## 2016-01-12 DIAGNOSIS — M25562 Pain in left knee: Secondary | ICD-10-CM | POA: Diagnosis not present

## 2016-01-12 DIAGNOSIS — M1712 Unilateral primary osteoarthritis, left knee: Secondary | ICD-10-CM | POA: Diagnosis not present

## 2016-01-12 DIAGNOSIS — M170 Bilateral primary osteoarthritis of knee: Secondary | ICD-10-CM | POA: Diagnosis not present

## 2016-01-12 DIAGNOSIS — R2689 Other abnormalities of gait and mobility: Secondary | ICD-10-CM | POA: Diagnosis not present

## 2016-01-17 DIAGNOSIS — M25562 Pain in left knee: Secondary | ICD-10-CM | POA: Diagnosis not present

## 2016-01-17 DIAGNOSIS — R2689 Other abnormalities of gait and mobility: Secondary | ICD-10-CM | POA: Diagnosis not present

## 2016-01-17 DIAGNOSIS — M170 Bilateral primary osteoarthritis of knee: Secondary | ICD-10-CM | POA: Diagnosis not present

## 2016-01-17 DIAGNOSIS — M25561 Pain in right knee: Secondary | ICD-10-CM | POA: Diagnosis not present

## 2016-01-17 DIAGNOSIS — M1711 Unilateral primary osteoarthritis, right knee: Secondary | ICD-10-CM | POA: Diagnosis not present

## 2016-02-14 DIAGNOSIS — I739 Peripheral vascular disease, unspecified: Secondary | ICD-10-CM | POA: Diagnosis not present

## 2016-02-15 DIAGNOSIS — M170 Bilateral primary osteoarthritis of knee: Secondary | ICD-10-CM | POA: Diagnosis not present

## 2016-02-27 DIAGNOSIS — J189 Pneumonia, unspecified organism: Secondary | ICD-10-CM | POA: Diagnosis not present

## 2016-03-19 DIAGNOSIS — R05 Cough: Secondary | ICD-10-CM | POA: Diagnosis not present

## 2016-04-23 DIAGNOSIS — I739 Peripheral vascular disease, unspecified: Secondary | ICD-10-CM | POA: Diagnosis not present

## 2016-05-17 DIAGNOSIS — M25561 Pain in right knee: Secondary | ICD-10-CM | POA: Diagnosis not present

## 2016-05-17 DIAGNOSIS — M1712 Unilateral primary osteoarthritis, left knee: Secondary | ICD-10-CM | POA: Diagnosis not present

## 2016-05-17 DIAGNOSIS — M1711 Unilateral primary osteoarthritis, right knee: Secondary | ICD-10-CM | POA: Diagnosis not present

## 2016-05-21 DIAGNOSIS — N390 Urinary tract infection, site not specified: Secondary | ICD-10-CM | POA: Diagnosis not present

## 2016-05-21 DIAGNOSIS — I10 Essential (primary) hypertension: Secondary | ICD-10-CM | POA: Diagnosis not present

## 2016-05-21 DIAGNOSIS — M816 Localized osteoporosis [Lequesne]: Secondary | ICD-10-CM | POA: Diagnosis not present

## 2016-05-21 DIAGNOSIS — R5383 Other fatigue: Secondary | ICD-10-CM | POA: Diagnosis not present

## 2016-05-21 DIAGNOSIS — E559 Vitamin D deficiency, unspecified: Secondary | ICD-10-CM | POA: Diagnosis not present

## 2016-05-24 DIAGNOSIS — Q8501 Neurofibromatosis, type 1: Secondary | ICD-10-CM | POA: Diagnosis not present

## 2016-05-24 DIAGNOSIS — M818 Other osteoporosis without current pathological fracture: Secondary | ICD-10-CM | POA: Diagnosis not present

## 2016-05-24 DIAGNOSIS — I10 Essential (primary) hypertension: Secondary | ICD-10-CM | POA: Diagnosis not present

## 2016-05-24 DIAGNOSIS — R350 Frequency of micturition: Secondary | ICD-10-CM | POA: Diagnosis not present

## 2016-05-24 DIAGNOSIS — Z23 Encounter for immunization: Secondary | ICD-10-CM | POA: Diagnosis not present

## 2016-05-25 DIAGNOSIS — H3530 Unspecified macular degeneration: Secondary | ICD-10-CM | POA: Diagnosis not present

## 2016-05-25 DIAGNOSIS — H52223 Regular astigmatism, bilateral: Secondary | ICD-10-CM | POA: Diagnosis not present

## 2016-05-25 DIAGNOSIS — H5203 Hypermetropia, bilateral: Secondary | ICD-10-CM | POA: Diagnosis not present

## 2016-05-25 DIAGNOSIS — H524 Presbyopia: Secondary | ICD-10-CM | POA: Diagnosis not present

## 2016-05-29 DIAGNOSIS — Z7982 Long term (current) use of aspirin: Secondary | ICD-10-CM | POA: Diagnosis not present

## 2016-05-29 DIAGNOSIS — R2689 Other abnormalities of gait and mobility: Secondary | ICD-10-CM | POA: Diagnosis not present

## 2016-05-31 DIAGNOSIS — R2689 Other abnormalities of gait and mobility: Secondary | ICD-10-CM | POA: Diagnosis not present

## 2016-05-31 DIAGNOSIS — Z7982 Long term (current) use of aspirin: Secondary | ICD-10-CM | POA: Diagnosis not present

## 2016-06-05 DIAGNOSIS — Z7982 Long term (current) use of aspirin: Secondary | ICD-10-CM | POA: Diagnosis not present

## 2016-06-05 DIAGNOSIS — R2689 Other abnormalities of gait and mobility: Secondary | ICD-10-CM | POA: Diagnosis not present

## 2016-06-07 DIAGNOSIS — R2689 Other abnormalities of gait and mobility: Secondary | ICD-10-CM | POA: Diagnosis not present

## 2016-06-07 DIAGNOSIS — Z7982 Long term (current) use of aspirin: Secondary | ICD-10-CM | POA: Diagnosis not present

## 2016-06-08 DIAGNOSIS — M810 Age-related osteoporosis without current pathological fracture: Secondary | ICD-10-CM | POA: Diagnosis not present

## 2016-06-08 DIAGNOSIS — I10 Essential (primary) hypertension: Secondary | ICD-10-CM | POA: Diagnosis not present

## 2016-06-08 DIAGNOSIS — L98499 Non-pressure chronic ulcer of skin of other sites with unspecified severity: Secondary | ICD-10-CM | POA: Diagnosis not present

## 2016-06-08 DIAGNOSIS — R739 Hyperglycemia, unspecified: Secondary | ICD-10-CM | POA: Diagnosis not present

## 2016-06-19 DIAGNOSIS — R2689 Other abnormalities of gait and mobility: Secondary | ICD-10-CM | POA: Diagnosis not present

## 2016-06-19 DIAGNOSIS — Z7982 Long term (current) use of aspirin: Secondary | ICD-10-CM | POA: Diagnosis not present

## 2016-06-21 DIAGNOSIS — Z7982 Long term (current) use of aspirin: Secondary | ICD-10-CM | POA: Diagnosis not present

## 2016-06-21 DIAGNOSIS — R2689 Other abnormalities of gait and mobility: Secondary | ICD-10-CM | POA: Diagnosis not present

## 2016-06-26 DIAGNOSIS — R2689 Other abnormalities of gait and mobility: Secondary | ICD-10-CM | POA: Diagnosis not present

## 2016-06-26 DIAGNOSIS — Z7982 Long term (current) use of aspirin: Secondary | ICD-10-CM | POA: Diagnosis not present

## 2016-06-28 DIAGNOSIS — R2689 Other abnormalities of gait and mobility: Secondary | ICD-10-CM | POA: Diagnosis not present

## 2016-06-28 DIAGNOSIS — Z7982 Long term (current) use of aspirin: Secondary | ICD-10-CM | POA: Diagnosis not present

## 2016-07-02 DIAGNOSIS — I739 Peripheral vascular disease, unspecified: Secondary | ICD-10-CM | POA: Diagnosis not present

## 2016-07-03 DIAGNOSIS — Z7982 Long term (current) use of aspirin: Secondary | ICD-10-CM | POA: Diagnosis not present

## 2016-07-03 DIAGNOSIS — R2689 Other abnormalities of gait and mobility: Secondary | ICD-10-CM | POA: Diagnosis not present

## 2016-07-05 DIAGNOSIS — Z7982 Long term (current) use of aspirin: Secondary | ICD-10-CM | POA: Diagnosis not present

## 2016-07-05 DIAGNOSIS — R2689 Other abnormalities of gait and mobility: Secondary | ICD-10-CM | POA: Diagnosis not present

## 2016-07-10 DIAGNOSIS — R2689 Other abnormalities of gait and mobility: Secondary | ICD-10-CM | POA: Diagnosis not present

## 2016-07-10 DIAGNOSIS — Z7982 Long term (current) use of aspirin: Secondary | ICD-10-CM | POA: Diagnosis not present

## 2016-07-12 DIAGNOSIS — Z7982 Long term (current) use of aspirin: Secondary | ICD-10-CM | POA: Diagnosis not present

## 2016-07-12 DIAGNOSIS — R2689 Other abnormalities of gait and mobility: Secondary | ICD-10-CM | POA: Diagnosis not present

## 2016-07-17 DIAGNOSIS — R2689 Other abnormalities of gait and mobility: Secondary | ICD-10-CM | POA: Diagnosis not present

## 2016-07-17 DIAGNOSIS — Z7982 Long term (current) use of aspirin: Secondary | ICD-10-CM | POA: Diagnosis not present

## 2016-07-19 DIAGNOSIS — Z7982 Long term (current) use of aspirin: Secondary | ICD-10-CM | POA: Diagnosis not present

## 2016-07-19 DIAGNOSIS — R2689 Other abnormalities of gait and mobility: Secondary | ICD-10-CM | POA: Diagnosis not present

## 2016-07-20 DIAGNOSIS — H16223 Keratoconjunctivitis sicca, not specified as Sjogren's, bilateral: Secondary | ICD-10-CM | POA: Diagnosis not present

## 2016-07-24 DIAGNOSIS — R2689 Other abnormalities of gait and mobility: Secondary | ICD-10-CM | POA: Diagnosis not present

## 2016-07-24 DIAGNOSIS — Z7982 Long term (current) use of aspirin: Secondary | ICD-10-CM | POA: Diagnosis not present

## 2016-07-26 DIAGNOSIS — Z7982 Long term (current) use of aspirin: Secondary | ICD-10-CM | POA: Diagnosis not present

## 2016-07-26 DIAGNOSIS — R2689 Other abnormalities of gait and mobility: Secondary | ICD-10-CM | POA: Diagnosis not present

## 2016-08-28 DIAGNOSIS — M25562 Pain in left knee: Secondary | ICD-10-CM | POA: Diagnosis not present

## 2016-08-28 DIAGNOSIS — M170 Bilateral primary osteoarthritis of knee: Secondary | ICD-10-CM | POA: Diagnosis not present

## 2016-08-28 DIAGNOSIS — M25561 Pain in right knee: Secondary | ICD-10-CM | POA: Diagnosis not present

## 2016-08-28 DIAGNOSIS — M1711 Unilateral primary osteoarthritis, right knee: Secondary | ICD-10-CM | POA: Diagnosis not present

## 2016-08-28 DIAGNOSIS — M1712 Unilateral primary osteoarthritis, left knee: Secondary | ICD-10-CM | POA: Diagnosis not present

## 2016-09-07 DIAGNOSIS — Z23 Encounter for immunization: Secondary | ICD-10-CM | POA: Diagnosis not present

## 2016-09-10 DIAGNOSIS — I739 Peripheral vascular disease, unspecified: Secondary | ICD-10-CM | POA: Diagnosis not present

## 2016-10-02 DIAGNOSIS — I10 Essential (primary) hypertension: Secondary | ICD-10-CM | POA: Diagnosis not present

## 2016-10-09 DIAGNOSIS — R05 Cough: Secondary | ICD-10-CM | POA: Diagnosis not present

## 2016-10-09 DIAGNOSIS — I10 Essential (primary) hypertension: Secondary | ICD-10-CM | POA: Diagnosis not present

## 2016-10-09 DIAGNOSIS — R351 Nocturia: Secondary | ICD-10-CM | POA: Diagnosis not present

## 2016-10-09 DIAGNOSIS — M818 Other osteoporosis without current pathological fracture: Secondary | ICD-10-CM | POA: Diagnosis not present

## 2016-11-20 DIAGNOSIS — R32 Unspecified urinary incontinence: Secondary | ICD-10-CM | POA: Diagnosis not present

## 2016-11-20 DIAGNOSIS — E559 Vitamin D deficiency, unspecified: Secondary | ICD-10-CM | POA: Diagnosis not present

## 2016-11-20 DIAGNOSIS — M818 Other osteoporosis without current pathological fracture: Secondary | ICD-10-CM | POA: Diagnosis not present

## 2016-11-20 DIAGNOSIS — I10 Essential (primary) hypertension: Secondary | ICD-10-CM | POA: Diagnosis not present

## 2016-11-21 DIAGNOSIS — M5442 Lumbago with sciatica, left side: Secondary | ICD-10-CM | POA: Diagnosis not present

## 2016-11-21 DIAGNOSIS — M9903 Segmental and somatic dysfunction of lumbar region: Secondary | ICD-10-CM | POA: Diagnosis not present

## 2016-11-23 DIAGNOSIS — M5442 Lumbago with sciatica, left side: Secondary | ICD-10-CM | POA: Diagnosis not present

## 2016-11-23 DIAGNOSIS — M9903 Segmental and somatic dysfunction of lumbar region: Secondary | ICD-10-CM | POA: Diagnosis not present

## 2016-11-26 DIAGNOSIS — M5442 Lumbago with sciatica, left side: Secondary | ICD-10-CM | POA: Diagnosis not present

## 2016-11-26 DIAGNOSIS — M9903 Segmental and somatic dysfunction of lumbar region: Secondary | ICD-10-CM | POA: Diagnosis not present

## 2016-11-28 DIAGNOSIS — M5442 Lumbago with sciatica, left side: Secondary | ICD-10-CM | POA: Diagnosis not present

## 2016-11-28 DIAGNOSIS — M9903 Segmental and somatic dysfunction of lumbar region: Secondary | ICD-10-CM | POA: Diagnosis not present

## 2016-12-03 DIAGNOSIS — I739 Peripheral vascular disease, unspecified: Secondary | ICD-10-CM | POA: Diagnosis not present

## 2016-12-04 DIAGNOSIS — M5442 Lumbago with sciatica, left side: Secondary | ICD-10-CM | POA: Diagnosis not present

## 2016-12-04 DIAGNOSIS — M9903 Segmental and somatic dysfunction of lumbar region: Secondary | ICD-10-CM | POA: Diagnosis not present

## 2016-12-10 DIAGNOSIS — M9903 Segmental and somatic dysfunction of lumbar region: Secondary | ICD-10-CM | POA: Diagnosis not present

## 2016-12-10 DIAGNOSIS — M5442 Lumbago with sciatica, left side: Secondary | ICD-10-CM | POA: Diagnosis not present

## 2016-12-10 DIAGNOSIS — M810 Age-related osteoporosis without current pathological fracture: Secondary | ICD-10-CM | POA: Diagnosis not present

## 2016-12-13 DIAGNOSIS — M9903 Segmental and somatic dysfunction of lumbar region: Secondary | ICD-10-CM | POA: Diagnosis not present

## 2016-12-13 DIAGNOSIS — M5442 Lumbago with sciatica, left side: Secondary | ICD-10-CM | POA: Diagnosis not present

## 2016-12-17 DIAGNOSIS — M9903 Segmental and somatic dysfunction of lumbar region: Secondary | ICD-10-CM | POA: Diagnosis not present

## 2016-12-17 DIAGNOSIS — M5442 Lumbago with sciatica, left side: Secondary | ICD-10-CM | POA: Diagnosis not present

## 2016-12-20 DIAGNOSIS — M5442 Lumbago with sciatica, left side: Secondary | ICD-10-CM | POA: Diagnosis not present

## 2016-12-20 DIAGNOSIS — M9903 Segmental and somatic dysfunction of lumbar region: Secondary | ICD-10-CM | POA: Diagnosis not present

## 2016-12-24 DIAGNOSIS — M9903 Segmental and somatic dysfunction of lumbar region: Secondary | ICD-10-CM | POA: Diagnosis not present

## 2016-12-24 DIAGNOSIS — M5442 Lumbago with sciatica, left side: Secondary | ICD-10-CM | POA: Diagnosis not present

## 2016-12-27 DIAGNOSIS — M9903 Segmental and somatic dysfunction of lumbar region: Secondary | ICD-10-CM | POA: Diagnosis not present

## 2016-12-27 DIAGNOSIS — M5442 Lumbago with sciatica, left side: Secondary | ICD-10-CM | POA: Diagnosis not present

## 2016-12-31 DIAGNOSIS — M5442 Lumbago with sciatica, left side: Secondary | ICD-10-CM | POA: Diagnosis not present

## 2016-12-31 DIAGNOSIS — M9903 Segmental and somatic dysfunction of lumbar region: Secondary | ICD-10-CM | POA: Diagnosis not present

## 2017-01-03 DIAGNOSIS — M9903 Segmental and somatic dysfunction of lumbar region: Secondary | ICD-10-CM | POA: Diagnosis not present

## 2017-01-03 DIAGNOSIS — M5442 Lumbago with sciatica, left side: Secondary | ICD-10-CM | POA: Diagnosis not present

## 2017-02-18 DIAGNOSIS — I739 Peripheral vascular disease, unspecified: Secondary | ICD-10-CM | POA: Diagnosis not present

## 2017-04-29 DIAGNOSIS — I739 Peripheral vascular disease, unspecified: Secondary | ICD-10-CM | POA: Diagnosis not present

## 2017-05-14 DIAGNOSIS — I10 Essential (primary) hypertension: Secondary | ICD-10-CM | POA: Diagnosis not present

## 2017-05-14 DIAGNOSIS — E559 Vitamin D deficiency, unspecified: Secondary | ICD-10-CM | POA: Diagnosis not present

## 2017-05-14 DIAGNOSIS — N390 Urinary tract infection, site not specified: Secondary | ICD-10-CM | POA: Diagnosis not present

## 2017-05-14 DIAGNOSIS — Z0000 Encounter for general adult medical examination without abnormal findings: Secondary | ICD-10-CM | POA: Diagnosis not present

## 2017-05-14 DIAGNOSIS — Z23 Encounter for immunization: Secondary | ICD-10-CM | POA: Diagnosis not present

## 2017-05-14 DIAGNOSIS — M818 Other osteoporosis without current pathological fracture: Secondary | ICD-10-CM | POA: Diagnosis not present

## 2017-05-21 DIAGNOSIS — R350 Frequency of micturition: Secondary | ICD-10-CM | POA: Diagnosis not present

## 2017-05-21 DIAGNOSIS — R197 Diarrhea, unspecified: Secondary | ICD-10-CM | POA: Diagnosis not present

## 2017-05-21 DIAGNOSIS — I10 Essential (primary) hypertension: Secondary | ICD-10-CM | POA: Diagnosis not present

## 2017-05-21 DIAGNOSIS — E559 Vitamin D deficiency, unspecified: Secondary | ICD-10-CM | POA: Diagnosis not present

## 2017-06-12 DIAGNOSIS — M810 Age-related osteoporosis without current pathological fracture: Secondary | ICD-10-CM | POA: Diagnosis not present

## 2017-07-08 DIAGNOSIS — I739 Peripheral vascular disease, unspecified: Secondary | ICD-10-CM | POA: Diagnosis not present

## 2017-07-18 DIAGNOSIS — H16223 Keratoconjunctivitis sicca, not specified as Sjogren's, bilateral: Secondary | ICD-10-CM | POA: Diagnosis not present

## 2017-08-01 DIAGNOSIS — H16223 Keratoconjunctivitis sicca, not specified as Sjogren's, bilateral: Secondary | ICD-10-CM | POA: Diagnosis not present

## 2017-08-01 DIAGNOSIS — H04123 Dry eye syndrome of bilateral lacrimal glands: Secondary | ICD-10-CM | POA: Diagnosis not present

## 2017-08-12 DIAGNOSIS — Z23 Encounter for immunization: Secondary | ICD-10-CM | POA: Diagnosis not present

## 2017-09-23 DIAGNOSIS — I739 Peripheral vascular disease, unspecified: Secondary | ICD-10-CM | POA: Diagnosis not present

## 2017-10-09 DIAGNOSIS — M25561 Pain in right knee: Secondary | ICD-10-CM | POA: Diagnosis not present

## 2017-10-09 DIAGNOSIS — M170 Bilateral primary osteoarthritis of knee: Secondary | ICD-10-CM | POA: Diagnosis not present

## 2017-10-09 DIAGNOSIS — M25562 Pain in left knee: Secondary | ICD-10-CM | POA: Diagnosis not present

## 2017-10-31 DIAGNOSIS — M25562 Pain in left knee: Secondary | ICD-10-CM | POA: Diagnosis not present

## 2017-10-31 DIAGNOSIS — M25561 Pain in right knee: Secondary | ICD-10-CM | POA: Diagnosis not present

## 2017-10-31 DIAGNOSIS — M1712 Unilateral primary osteoarthritis, left knee: Secondary | ICD-10-CM | POA: Diagnosis not present

## 2017-10-31 DIAGNOSIS — M1711 Unilateral primary osteoarthritis, right knee: Secondary | ICD-10-CM | POA: Diagnosis not present

## 2017-11-20 DIAGNOSIS — I10 Essential (primary) hypertension: Secondary | ICD-10-CM | POA: Diagnosis not present

## 2017-11-20 DIAGNOSIS — E559 Vitamin D deficiency, unspecified: Secondary | ICD-10-CM | POA: Diagnosis not present

## 2017-11-26 DIAGNOSIS — E559 Vitamin D deficiency, unspecified: Secondary | ICD-10-CM | POA: Diagnosis not present

## 2017-11-26 DIAGNOSIS — Z0000 Encounter for general adult medical examination without abnormal findings: Secondary | ICD-10-CM | POA: Diagnosis not present

## 2017-11-26 DIAGNOSIS — I10 Essential (primary) hypertension: Secondary | ICD-10-CM | POA: Diagnosis not present

## 2017-11-26 DIAGNOSIS — M810 Age-related osteoporosis without current pathological fracture: Secondary | ICD-10-CM | POA: Diagnosis not present

## 2017-11-26 DIAGNOSIS — Q8501 Neurofibromatosis, type 1: Secondary | ICD-10-CM | POA: Diagnosis not present

## 2017-12-16 DIAGNOSIS — I739 Peripheral vascular disease, unspecified: Secondary | ICD-10-CM | POA: Diagnosis not present

## 2017-12-19 DIAGNOSIS — M810 Age-related osteoporosis without current pathological fracture: Secondary | ICD-10-CM | POA: Diagnosis not present

## 2018-03-10 DIAGNOSIS — I739 Peripheral vascular disease, unspecified: Secondary | ICD-10-CM | POA: Diagnosis not present

## 2018-05-19 DIAGNOSIS — N390 Urinary tract infection, site not specified: Secondary | ICD-10-CM | POA: Diagnosis not present

## 2018-05-19 DIAGNOSIS — E559 Vitamin D deficiency, unspecified: Secondary | ICD-10-CM | POA: Diagnosis not present

## 2018-05-19 DIAGNOSIS — I10 Essential (primary) hypertension: Secondary | ICD-10-CM | POA: Diagnosis not present

## 2018-05-26 DIAGNOSIS — E559 Vitamin D deficiency, unspecified: Secondary | ICD-10-CM | POA: Diagnosis not present

## 2018-05-26 DIAGNOSIS — I10 Essential (primary) hypertension: Secondary | ICD-10-CM | POA: Diagnosis not present

## 2018-05-26 DIAGNOSIS — N390 Urinary tract infection, site not specified: Secondary | ICD-10-CM | POA: Diagnosis not present

## 2018-05-26 DIAGNOSIS — M810 Age-related osteoporosis without current pathological fracture: Secondary | ICD-10-CM | POA: Diagnosis not present

## 2018-05-26 DIAGNOSIS — Q8501 Neurofibromatosis, type 1: Secondary | ICD-10-CM | POA: Diagnosis not present

## 2018-05-27 DIAGNOSIS — I739 Peripheral vascular disease, unspecified: Secondary | ICD-10-CM | POA: Diagnosis not present

## 2018-06-19 DIAGNOSIS — Z780 Asymptomatic menopausal state: Secondary | ICD-10-CM | POA: Diagnosis not present

## 2018-06-19 DIAGNOSIS — N3946 Mixed incontinence: Secondary | ICD-10-CM | POA: Diagnosis not present

## 2018-06-19 DIAGNOSIS — M25561 Pain in right knee: Secondary | ICD-10-CM | POA: Diagnosis not present

## 2018-06-19 DIAGNOSIS — I10 Essential (primary) hypertension: Secondary | ICD-10-CM | POA: Diagnosis not present

## 2018-06-20 DIAGNOSIS — M810 Age-related osteoporosis without current pathological fracture: Secondary | ICD-10-CM | POA: Diagnosis not present

## 2018-09-02 DIAGNOSIS — I739 Peripheral vascular disease, unspecified: Secondary | ICD-10-CM | POA: Diagnosis not present

## 2018-11-20 DIAGNOSIS — I10 Essential (primary) hypertension: Secondary | ICD-10-CM | POA: Diagnosis not present

## 2018-11-27 DIAGNOSIS — R05 Cough: Secondary | ICD-10-CM | POA: Diagnosis not present

## 2018-11-27 DIAGNOSIS — G8929 Other chronic pain: Secondary | ICD-10-CM | POA: Diagnosis not present

## 2018-11-27 DIAGNOSIS — R32 Unspecified urinary incontinence: Secondary | ICD-10-CM | POA: Diagnosis not present

## 2018-11-27 DIAGNOSIS — N3946 Mixed incontinence: Secondary | ICD-10-CM | POA: Diagnosis not present

## 2018-11-27 DIAGNOSIS — E559 Vitamin D deficiency, unspecified: Secondary | ICD-10-CM | POA: Diagnosis not present

## 2018-11-27 DIAGNOSIS — M810 Age-related osteoporosis without current pathological fracture: Secondary | ICD-10-CM | POA: Diagnosis not present

## 2018-11-27 DIAGNOSIS — Q8501 Neurofibromatosis, type 1: Secondary | ICD-10-CM | POA: Diagnosis not present

## 2018-11-27 DIAGNOSIS — N3281 Overactive bladder: Secondary | ICD-10-CM | POA: Diagnosis not present

## 2018-11-27 DIAGNOSIS — M25561 Pain in right knee: Secondary | ICD-10-CM | POA: Diagnosis not present

## 2018-11-27 DIAGNOSIS — I10 Essential (primary) hypertension: Secondary | ICD-10-CM | POA: Diagnosis not present

## 2018-11-27 DIAGNOSIS — J0100 Acute maxillary sinusitis, unspecified: Secondary | ICD-10-CM | POA: Diagnosis not present

## 2018-11-27 DIAGNOSIS — R197 Diarrhea, unspecified: Secondary | ICD-10-CM | POA: Diagnosis not present

## 2018-12-24 DIAGNOSIS — M810 Age-related osteoporosis without current pathological fracture: Secondary | ICD-10-CM | POA: Diagnosis not present

## 2018-12-29 DIAGNOSIS — I739 Peripheral vascular disease, unspecified: Secondary | ICD-10-CM | POA: Diagnosis not present

## 2019-01-28 ENCOUNTER — Encounter (HOSPITAL_COMMUNITY): Payer: Self-pay | Admitting: Emergency Medicine

## 2019-01-28 ENCOUNTER — Other Ambulatory Visit: Payer: Self-pay

## 2019-01-28 ENCOUNTER — Emergency Department (HOSPITAL_COMMUNITY): Payer: Medicare Other

## 2019-01-28 ENCOUNTER — Inpatient Hospital Stay (HOSPITAL_COMMUNITY)
Admission: EM | Admit: 2019-01-28 | Discharge: 2019-01-30 | DRG: 064 | Disposition: A | Discharge status: Home Health | Payer: Medicare Other | Attending: Internal Medicine | Admitting: Internal Medicine

## 2019-01-28 DIAGNOSIS — I10 Essential (primary) hypertension: Secondary | ICD-10-CM | POA: Diagnosis present

## 2019-01-28 DIAGNOSIS — R29702 NIHSS score 2: Secondary | ICD-10-CM | POA: Diagnosis present

## 2019-01-28 DIAGNOSIS — M1990 Unspecified osteoarthritis, unspecified site: Secondary | ICD-10-CM | POA: Diagnosis present

## 2019-01-28 DIAGNOSIS — R404 Transient alteration of awareness: Secondary | ICD-10-CM | POA: Diagnosis not present

## 2019-01-28 DIAGNOSIS — I639 Cerebral infarction, unspecified: Principal | ICD-10-CM | POA: Diagnosis present

## 2019-01-28 DIAGNOSIS — G9341 Metabolic encephalopathy: Secondary | ICD-10-CM | POA: Diagnosis present

## 2019-01-28 DIAGNOSIS — G9340 Encephalopathy, unspecified: Secondary | ICD-10-CM | POA: Diagnosis present

## 2019-01-28 DIAGNOSIS — R4182 Altered mental status, unspecified: Secondary | ICD-10-CM | POA: Diagnosis not present

## 2019-01-28 DIAGNOSIS — Q8500 Neurofibromatosis, unspecified: Secondary | ICD-10-CM

## 2019-01-28 DIAGNOSIS — G454 Transient global amnesia: Secondary | ICD-10-CM | POA: Diagnosis present

## 2019-01-28 DIAGNOSIS — Z993 Dependence on wheelchair: Secondary | ICD-10-CM | POA: Diagnosis not present

## 2019-01-28 DIAGNOSIS — Z90710 Acquired absence of both cervix and uterus: Secondary | ICD-10-CM

## 2019-01-28 DIAGNOSIS — R29701 NIHSS score 1: Secondary | ICD-10-CM | POA: Diagnosis not present

## 2019-01-28 DIAGNOSIS — D72829 Elevated white blood cell count, unspecified: Secondary | ICD-10-CM | POA: Diagnosis present

## 2019-01-28 DIAGNOSIS — J9811 Atelectasis: Secondary | ICD-10-CM | POA: Diagnosis not present

## 2019-01-28 DIAGNOSIS — E871 Hypo-osmolality and hyponatremia: Secondary | ICD-10-CM | POA: Diagnosis not present

## 2019-01-28 DIAGNOSIS — I459 Conduction disorder, unspecified: Secondary | ICD-10-CM | POA: Diagnosis present

## 2019-01-28 DIAGNOSIS — Z885 Allergy status to narcotic agent status: Secondary | ICD-10-CM

## 2019-01-28 HISTORY — DX: Essential (primary) hypertension: I10

## 2019-01-28 LAB — URINALYSIS, ROUTINE W REFLEX MICROSCOPIC
BILIRUBIN URINE: NEGATIVE
Glucose, UA: NEGATIVE mg/dL
Hgb urine dipstick: NEGATIVE
Ketones, ur: 5 mg/dL — AB
Leukocytes,Ua: NEGATIVE
Nitrite: NEGATIVE
Protein, ur: 30 mg/dL — AB
SPECIFIC GRAVITY, URINE: 1.016 (ref 1.005–1.030)
pH: 5 (ref 5.0–8.0)

## 2019-01-28 LAB — DIFFERENTIAL
Abs Immature Granulocytes: 0.07 10*3/uL (ref 0.00–0.07)
Basophils Absolute: 0 10*3/uL (ref 0.0–0.1)
Basophils Relative: 0 %
EOS PCT: 2 %
Eosinophils Absolute: 0.3 10*3/uL (ref 0.0–0.5)
Immature Granulocytes: 1 %
Lymphocytes Relative: 17 %
Lymphs Abs: 2.6 10*3/uL (ref 0.7–4.0)
Monocytes Absolute: 0.9 10*3/uL (ref 0.1–1.0)
Monocytes Relative: 6 %
Neutro Abs: 10.9 10*3/uL — ABNORMAL HIGH (ref 1.7–7.7)
Neutrophils Relative %: 74 %

## 2019-01-28 LAB — RAPID URINE DRUG SCREEN, HOSP PERFORMED
AMPHETAMINES: NOT DETECTED
Barbiturates: NOT DETECTED
Benzodiazepines: NOT DETECTED
Cocaine: NOT DETECTED
Opiates: NOT DETECTED
Tetrahydrocannabinol: NOT DETECTED

## 2019-01-28 LAB — CBC
HCT: 44.1 % (ref 36.0–46.0)
Hemoglobin: 13.9 g/dL (ref 12.0–15.0)
MCH: 30.2 pg (ref 26.0–34.0)
MCHC: 31.5 g/dL (ref 30.0–36.0)
MCV: 95.7 fL (ref 80.0–100.0)
Platelets: 371 10*3/uL (ref 150–400)
RBC: 4.61 MIL/uL (ref 3.87–5.11)
RDW: 14.2 % (ref 11.5–15.5)
WBC: 14.7 10*3/uL — AB (ref 4.0–10.5)
nRBC: 0 % (ref 0.0–0.2)

## 2019-01-28 LAB — PROTIME-INR
INR: 1.1 (ref 0.8–1.2)
Prothrombin Time: 14 seconds (ref 11.4–15.2)

## 2019-01-28 LAB — COMPREHENSIVE METABOLIC PANEL
ALK PHOS: 75 U/L (ref 38–126)
ALT: 20 U/L (ref 0–44)
AST: 27 U/L (ref 15–41)
Albumin: 4.4 g/dL (ref 3.5–5.0)
Anion gap: 9 (ref 5–15)
BUN: 19 mg/dL (ref 8–23)
CO2: 22 mmol/L (ref 22–32)
Calcium: 8.3 mg/dL — ABNORMAL LOW (ref 8.9–10.3)
Chloride: 99 mmol/L (ref 98–111)
Creatinine, Ser: 0.63 mg/dL (ref 0.44–1.00)
GFR calc Af Amer: >60 mL/min (ref >60)
GFR calc non Af Amer: >60 mL/min (ref >60)
GLUCOSE: 117 mg/dL — AB (ref 70–99)
Potassium: 4 mmol/L (ref 3.5–5.1)
Sodium: 130 mmol/L — ABNORMAL LOW (ref 135–145)
TOTAL PROTEIN: 7.4 g/dL (ref 6.5–8.1)
Total Bilirubin: 0.7 mg/dL (ref 0.3–1.2)

## 2019-01-28 LAB — APTT: aPTT: 43 seconds — ABNORMAL HIGH (ref 24–36)

## 2019-01-28 LAB — ETHANOL: Alcohol, Ethyl (B): <10 mg/dL (ref <10)

## 2019-01-28 LAB — TROPONIN I: Troponin I: <0.03 ng/mL (ref <0.03)

## 2019-01-28 LAB — TSH: TSH: 4.144 u[IU]/mL (ref 0.350–4.500)

## 2019-01-28 LAB — CBG MONITORING, ED: Glucose-Capillary: 111 mg/dL — ABNORMAL HIGH (ref 70–99)

## 2019-01-28 MED ORDER — SODIUM CHLORIDE 0.9 % IV SOLN
INTRAVENOUS | Status: DC
  Administered 2019-01-28 – 2019-01-29 (×2): via INTRAVENOUS

## 2019-01-28 MED ORDER — ASPIRIN 325 MG PO TABS
325.0000 mg | ORAL_TABLET | Freq: Every day | ORAL | Status: DC
  Administered 2019-01-28 – 2019-01-30 (×3): 325 mg via ORAL
  Filled 2019-01-28 (×3): qty 1

## 2019-01-28 NOTE — H&P (Signed)
[facility] H&P   Patient Demographics:    Tiffany Shannon, is a 85 y.o. female  MRN: [id number]   DOB - 30-Sep-1923  Admit Date - 01/28/2019  Outpatient Primary MD for the patient is [name], MD  Referring MD: Dr. Thurnell Garbe  Outpatient Specialists: None  Patient coming from: Home  Chief Complaint  Patient presents with  . Altered Mental Status      HPI:    Tiffany Shannon  is a 85 y.o. female, with history of hypertension, wheelchair-bound who was brought to the ED by family after she was found to stop responding for almost 10 minutes this evening.  Family was [place] when trying to get out of the car patient stopped responding to any questions.  She took off her sunglasses and then with just tearing, not responding to any questions.  She was " staring off" and making chewing motions clicking her teeth".  After about 10 minutes she started to respond and family drove her to the ED.  Within 5 minutes after that she was communicating normally and said that she " just felt hot".  Family did not notice any bowel or urinary incontinence. Denies any headache, blurred vision, dizziness, nausea, vomiting, cough, chest pain, shortness of breath, abdominal pain, dysuria or diarrhea.  Denies any tingling numbness or weakness of her extremities. No recent fall or head trauma.  No recent fevers, chills, recent illness, sick contact or recent travel.  No changes in her medication (reportedly takes blood pressure medication with the family does not remember). Patient is wheelchair-bound at baseline and lives with her elderly husband who helps with her ADLs. At baseline she is alert and oriented and able to carry out normal conversations.  Course in the ED Vitals were stable except for elevated blood pressure of 162/114 mmHg.  Blood work show WBC of 14.7K, normal hemoglobin and platelets.   Sodium of 130 (noted to be chronically low in the system although no recent baseline available).  Blood glucose of 117.  UA negative for infection.  Alcohol level and urine drug screen negative.  Chest x-ray negative for infiltrate.  CT head showed chronic atrophy with small vessel ischemic changes. EKG showed normal sinus rhythm with no ST-T changes.  ED physician spoke with tele-neurologist who suggested based on history this could likely be seizures and recommended getting EEG and an MRI to rule out stroke as well.  Recommended not starting antiepileptic given possible single episode and her age. Hospitalist consulted for observation to telemetry.    Review of systems:    In addition to the HPI above,  No Fever-chills, No Headache, No changes with Vision or hearing, No problems swallowing food or Liquids, No Chest pain, Cough or Shortness of Breath, No Abdominal pain, No Nausea or vomiting, Bowel movements are regular, No Blood in stool or Urine, No dysuria,  No new skin rashes or bruises, No new joints pains-aches,  No new weakness, tingling, numbness in any extremity, No recent weight gain or loss, No polyuria, polydypsia or polyphagia, No significant Mental Stressors.    With Past History of the following :    Past Medical History:  Diagnosis Date  . Hypertension       Past Surgical History:  Procedure Laterality Date  . ABDOMINAL HYSTERECTOMY    . APPENDECTOMY    . TONSILLECTOMY        Social History:     Social History   Tobacco Use  . Smoking status: Never Smoker  . Smokeless tobacco: Never Used  Substance Use Topics  . Alcohol use: Never    Frequency: Never     Lives -home with husband  Mobility -wheelchair-bound     Family History :   No family history of heart disease or stroke   Home Medications:   Prior to Admission medications   Not on File   Reportedly on antihypertensive medication and once a day aspirin.   Allergies:      Allergies  Allergen Reactions  . Codeine Nausea And Vomiting     Physical Exam:   Vitals  Blood pressure (!) 162/114, pulse (!) 104, temperature 98.7 F (37.1 C), temperature source Oral, resp. rate 15, height 5' (1.524 m), weight 63.5 kg, SpO2 100 %.  General: Elderly female lying in bed in no acute distress HEENT: Pupils reactive bilaterally, EOMI, no pallor, no icterus, moist mucosa, supple neck, no cervical lymphadenopathy Chest: Clear to auscultation bilaterally, no added sounds CVS: Normal S1-S2, no murmurs rub or gallop GI: Soft, nondistended, nontender, bowel sounds present Musculoskeletal: Warm, no edema CNS: Alert and oriented x2-3 (at baseline per family).  Normal motor tone, power and reflex, normal sensations bilaterally.  Cerebellar function and gait not assessed.     Data Review:    CBC Recent Labs  Lab 01/28/19 1833  WBC 14.7*  HGB 13.9  HCT 44.1  PLT 371  MCV 95.7  MCH 30.2  MCHC 31.5  RDW 14.2  LYMPHSABS 2.6  MONOABS 0.9  EOSABS 0.3  BASOSABS 0.0   ------------------------------------------------------------------------------------------------------------------  Chemistries  Recent Labs  Lab 01/28/19 1833  NA 130*  K 4.0  CL 99  CO2 22  GLUCOSE 117*  BUN 19  CREATININE 0.63  CALCIUM 8.3*  AST 27  ALT 20  ALKPHOS 75  BILITOT 0.7   ------------------------------------------------------------------------------------------------------------------ estimated creatinine clearance is 36.6 mL/min (by C-G formula based on SCr of 0.63 mg/dL). ------------------------------------------------------------------------------------------------------------------ No results for input(s): TSH, T4TOTAL, T3FREE, THYROIDAB in the last 72 hours.  Invalid input(s): FREET3  Coagulation profile Recent Labs  Lab 01/28/19 1833  INR 1.1   -------------------------------------------------------------------------------------------------------------------  No results for input(s): DDIMER in the last 72 hours. -------------------------------------------------------------------------------------------------------------------  Cardiac Enzymes Recent Labs  Lab 01/28/19 1833  TROPONINI <0.03   ------------------------------------------------------------------------------------------------------------------ No results found for: BNP   ---------------------------------------------------------------------------------------------------------------  Urinalysis    Component Value Date/Time   COLORURINE YELLOW 01/28/2019 1924   APPEARANCEUR HAZY (A) 01/28/2019 1924   LABSPEC 1.016 01/28/2019 1924   PHURINE 5.0 01/28/2019 1924   GLUCOSEU NEGATIVE 01/28/2019 1924   HGBUR NEGATIVE 01/28/2019 1924   BILIRUBINUR NEGATIVE 01/28/2019 1924   KETONESUR 5 (A) 01/28/2019 1924   PROTEINUR 30 (A) 01/28/2019 1924   UROBILINOGEN 0.2 09/30/2009 1909   NITRITE NEGATIVE 01/28/2019 1924   LEUKOCYTESUR NEGATIVE 01/28/2019 1924    ----------------------------------------------------------------------------------------------------------------   Imaging Results:    Dg Chest  2 View  Result Date: 01/28/2019 CLINICAL DATA:  Altered mental status EXAM: CHEST - 2 VIEW COMPARISON:  04/26/2015 FINDINGS: Increased prominence of the right hilum compared to 04/26/2015. There is bibasilar atelectasis without focal consolidation. No pleural effusion or pneumothorax. Diffuse soft tissue nodularity is unchanged. IMPRESSION: 1. Increased right hilar prominence relative to the prior chest radiograph. This may be due to differences in position/rotation, but chest CT should be considered for better characterization. 2. No acute airspace disease. Electronically Signed   By: Ulyses Jarred M.D.   On: 01/28/2019 20:29   Ct Head Wo Contrast  Result Date: 01/28/2019 CLINICAL DATA:  Altered mental status. Possible aphasia. EXAM: CT HEAD WITHOUT CONTRAST TECHNIQUE: Contiguous axial  images were obtained from the base of the skull through the vertex without intravenous contrast. COMPARISON:  03/11/2014 FINDINGS: Brain: Diffuse cerebral atrophy. Ventricular dilatation consistent with central atrophy. Low-attenuation changes in the deep white matter consistent with small vessel ischemia. No mass-effect or midline shift. No abnormal extra-axial fluid collections. Gray-white matter junctions are distinct. Basal cisterns are not effaced. No acute intracranial hemorrhage. Vascular: Moderate intracranial arterial vascular calcifications. Skull: Calvarium appears intact. Sinuses/Orbits: Paranasal sinuses and mastoid air cells are clear. Other: None. IMPRESSION: No acute intracranial abnormalities. Chronic atrophy and small vessel ischemic changes. Electronically Signed   By: Lucienne Capers M.D.   On: 01/28/2019 20:06    My personal review of EKG: Normal sinus rhythm at 92, nonspecific interventricular conduction delay.  Assessment & Plan:    Active Problems: Transient global amnesia  Placed on observation on telemetry.  Possible for seizure activity versus TIA.  Given her elevated blood pressure hypertensive encephalopathy is another possibility (per husband she does not check her blood pressure at home). CT head negative for acute findings.  Neurochecks every 4 hour. Order MRI of the brain and EEG. Seizure precaution.  Add PRN Ativan for seizures.  Hold off on antiseizure medication per ED physician who discussed with telemetry neurologist. Neurology consult.  PT evaluation in a.m.    Essential hypertension Elevated blood pressure.  Resume her home blood pressure medication once known.  Will place on PRN IV hydralazine    Hyponatremia Appears to be chronic.  Monitor for now.  Leukocytosis No clinical sign of infection.  Monitor for now.   DVT Prophylaxis: Subcu Lovenox  AM Labs Ordered, also please review Full Orders  Family Communication: Admission, patients condition and  plan of care including tests being ordered have been discussed with the patient, her husband and son at bedside  Code Status full code (discussed with husband and he wants full scope of treatment for now)  Likely DC to home once work-up completed  Condition: Fair  Consults called: Neurology  Admission status: Observation  Time spent in minutes : 45   Tiffany Shannon M.D on 01/28/2019 at 9:31 PM  Between 7am to 7pm - Pager - [phone]. After 7pm go to [email or web address] - password [email or web address]  [facility] - Office  [phone]

## 2019-01-28 NOTE — ED Triage Notes (Signed)
Family member states they went out to eat and when getting out of the car, patient stopped responding to questions. States "she took off her sunglasses and just sat there. She was awake but just wouldn't talk to [place]." States patient started talking upon arrival to ER. Patient states "I just got hot."

## 2019-01-28 NOTE — ED Notes (Signed)
Patient transported to CT 

## 2019-01-28 NOTE — ED Provider Notes (Signed)
[facility] EMERGENCY DEPARTMENT Provider Note   CSN: [id number] Arrival date & time: 01/28/19  1749    History   Chief Complaint Chief Complaint  Patient presents with   Altered Mental Status    HPI Tiffany Shannon is a 85 y.o. female.     HPI  Pt was seen at 1830. Per pt's family and pt: Pt with sudden onset and resolution of one episode of AMS that occurred while sitting in the car PTA. Pt's family states pt was "sitting in the car, took off her sunglasses and just sat there." Family states pt was "unresponsive," "staring off" and "making chewing motions with her mouth/clicking her teeth." This lasted for "10 minutes" per family at bedside. Pt then "just started talking to [place] again." Pt states she does not remember events other than her family telling her that they were taking her to the hospital. Pt states "I think I just got hot." Denies any specific complaint. Family states she is at her baseline now. Pt does not stand or walk per family. Denies body/extremity movement. Denies apnea. Denies CP/palpitations, no SOB/cough, no abd pain, no N/V/D.   Past Medical History:  Diagnosis Date   Hypertension     There are no active problems to display for this patient.   Past Surgical History:  Procedure Laterality Date   ABDOMINAL HYSTERECTOMY     APPENDECTOMY     TONSILLECTOMY       OB History   No obstetric history on file.      Home Medications    Prior to Admission medications   Not on File    Family History History reviewed. No pertinent family history.  Social History Social History   Tobacco Use   Smoking status: Never Smoker   Smokeless tobacco: Never Used  Substance Use Topics   Alcohol use: Never    Frequency: Never   Drug use: Never     Allergies   Codeine   Review of Systems Review of Systems ROS: Statement: All systems negative except as marked or noted in the HPI; Constitutional: Negative for fever and chills. ; ; Eyes:  Negative for eye pain, redness and discharge. ; ; ENMT: Negative for ear pain, hoarseness, nasal congestion, sinus pressure and sore throat. ; ; Cardiovascular: Negative for chest pain, palpitations, diaphoresis, dyspnea and peripheral edema. ; ; Respiratory: Negative for cough, wheezing and stridor. ; ; Gastrointestinal: Negative for nausea, vomiting, diarrhea, abdominal pain, blood in stool, hematemesis, jaundice and rectal bleeding. . ; ; Genitourinary: Negative for dysuria, flank pain and hematuria. ; ; Musculoskeletal: Negative for back pain and neck pain. Negative for swelling and trauma.; ; Skin: Negative for pruritus, rash, abrasions, blisters, bruising and skin lesion.; ; Neuro: Negative for headache, lightheadedness and neck stiffness. Negative for weakness, extremity weakness, paresthesias, involuntary movement, seizure and +AMS.       Physical Exam Updated Vital Signs BP (!) 157/81    Pulse 93    Temp 98.7 F (37.1 C) (Oral)    Resp (!) 23    Ht 5' (1.524 m)    Wt 63.5 kg    SpO2 100%    BMI 27.34 kg/m    Patient Vitals for the past 24 hrs:  BP Temp Temp src Pulse Resp SpO2 Height Weight  01/28/19 2100 (!) 162/114 -- -- (!) 104 15 100 % -- --  01/28/19 2000 (!) 150/94 -- -- 96 (!) 23 97 % -- --  01/28/19 1900 (!) 157/81 -- --  93 (!) 23 100 % -- --  01/28/19 1830 (!) 151/65 -- -- 87 10 96 % -- --  01/28/19 1815 -- -- -- -- -- -- 5' (1.524 m) 63.5 kg  01/28/19 1814 133/73 98.7 F (37.1 C) Oral 91 18 99 % -- --     Physical Exam 1835: Physical examination:  Nursing notes reviewed; Vital signs and O2 SAT reviewed;  Constitutional:  Thin, frail. In no acute distress; Head:  Normocephalic, atraumatic; Eyes: EOMI, PERRL, No scleral icterus; ENMT: Mouth and pharynx normal, Mucous membranes dry; Neck: Supple, Full range of motion, No lymphadenopathy; Cardiovascular: Regular rate and rhythm, No gallop; Respiratory: Breath sounds clear & equal bilaterally, No wheezes.  Speaking full  sentences with ease, Normal respiratory effort/excursion; Chest: Nontender, Movement normal; Abdomen: Soft, Nontender, Nondistended, Normal bowel sounds; Genitourinary: No CVA tenderness; Extremities: Peripheral pulses normal, +legs contracted at hips. No tenderness, No edema, No calf edema or asymmetry.; Neuro: AA&Ox3, Major CN grossly intact. No facial droop. Speech clear. Pt moves all extremities spontaneously on stretcher. Family states pt does not stand/walk. Family states pt is at her baseline now..; Skin: Color normal, Warm, Dry.   ED Treatments / Results  Labs (all labs ordered are listed, but only abnormal results are displayed)   EKG None  Radiology   Procedures Procedures (including critical care time)  Medications Ordered in ED Medications - No data to display   Initial Impression / Assessment and Plan / ED Course  I have reviewed the triage vital signs and the nursing notes.  Pertinent labs & imaging results that were available during my care of the patient were reviewed by me and considered in my medical decision making (see chart for details).     MDM Reviewed: previous chart, nursing note and vitals Reviewed previous: labs Interpretation: labs, ECG, x-ray and CT scan    ED ECG REPORT   Date: 01/28/2019  Rate: 92  Rhythm: normal sinus rhythm  QRS Axis: normal  Intervals: normal  ST/T Wave abnormalities: normal  Conduction Disutrbances:nonspecific intraventricular conduction delay  Narrative Interpretation: Artifact and baseline wander are present  Old EKG Reviewed: none available I have personally reviewed the EKG tracing and agree with the computerized printout as noted.   Results for orders placed or performed during the hospital encounter of 01/28/19  Ethanol  Result Value Ref Range   Alcohol, Ethyl (B) <10 <10 mg/dL  Protime-INR  Result Value Ref Range   Prothrombin Time 14.0 11.4 - 15.2 seconds   INR 1.1 0.8 - 1.2  APTT  Result Value Ref  Range   aPTT 43 (H) 24 - 36 seconds  CBC  Result Value Ref Range   WBC 14.7 (H) 4.0 - 10.5 K/uL   RBC 4.61 3.87 - 5.11 MIL/uL   Hemoglobin 13.9 12.0 - 15.0 g/dL   HCT 44.1 36.0 - 46.0 %   MCV 95.7 80.0 - 100.0 fL   MCH 30.2 26.0 - 34.0 pg   MCHC 31.5 30.0 - 36.0 g/dL   RDW 14.2 11.5 - 15.5 %   Platelets 371 150 - 400 K/uL   nRBC 0.0 0.0 - 0.2 %  Differential  Result Value Ref Range   Neutrophils Relative % 74 %   Neutro Abs 10.9 (H) 1.7 - 7.7 K/uL   Lymphocytes Relative 17 %   Lymphs Abs 2.6 0.7 - 4.0 K/uL   Monocytes Relative 6 %   Monocytes Absolute 0.9 0.1 - 1.0 K/uL   Eosinophils Relative 2 %  Eosinophils Absolute 0.3 0.0 - 0.5 K/uL   Basophils Relative 0 %   Basophils Absolute 0.0 0.0 - 0.1 K/uL   Immature Granulocytes 1 %   Abs Immature Granulocytes 0.07 0.00 - 0.07 K/uL  Comprehensive metabolic panel  Result Value Ref Range   Sodium 130 (L) 135 - 145 mmol/L   Potassium 4.0 3.5 - 5.1 mmol/L   Chloride 99 98 - 111 mmol/L   CO2 22 22 - 32 mmol/L   Glucose, Bld 117 (H) 70 - 99 mg/dL   BUN 19 8 - 23 mg/dL   Creatinine, Ser 0.63 0.44 - 1.00 mg/dL   Calcium 8.3 (L) 8.9 - 10.3 mg/dL   Total Protein 7.4 6.5 - 8.1 g/dL   Albumin 4.4 3.5 - 5.0 g/dL   AST 27 15 - 41 U/L   ALT 20 0 - 44 U/L   Alkaline Phosphatase 75 38 - 126 U/L   Total Bilirubin 0.7 0.3 - 1.2 mg/dL   GFR calc non Af Amer >60 >60 mL/min   GFR calc Af Amer >60 >60 mL/min   Anion gap 9 5 - 15  Urine rapid drug screen (hosp performed)not at [facility]  Result Value Ref Range   Opiates NONE DETECTED NONE DETECTED   Cocaine NONE DETECTED NONE DETECTED   Benzodiazepines NONE DETECTED NONE DETECTED   Amphetamines NONE DETECTED NONE DETECTED   Tetrahydrocannabinol NONE DETECTED NONE DETECTED   Barbiturates NONE DETECTED NONE DETECTED  Urinalysis, Routine w reflex microscopic  Result Value Ref Range   Color, Urine YELLOW YELLOW   APPearance HAZY (A) CLEAR   Specific Gravity, Urine 1.016 1.005 - 1.030   pH 5.0  5.0 - 8.0   Glucose, UA NEGATIVE NEGATIVE mg/dL   Hgb urine dipstick NEGATIVE NEGATIVE   Bilirubin Urine NEGATIVE NEGATIVE   Ketones, ur 5 (A) NEGATIVE mg/dL   Protein, ur 30 (A) NEGATIVE mg/dL   Nitrite NEGATIVE NEGATIVE   Leukocytes,Ua NEGATIVE NEGATIVE   WBC, UA 0-5 0 - 5 WBC/hpf   Bacteria, UA RARE (A) NONE SEEN   Squamous Epithelial / LPF 0-5 0 - 5  Troponin I - ONCE - STAT  Result Value Ref Range   Troponin I <0.03 <0.03 ng/mL  CBG monitoring, ED  Result Value Ref Range   Glucose-Capillary 111 (H) 70 - 99 mg/dL   Dg Chest 2 View Result Date: 01/28/2019 CLINICAL DATA:  Altered mental status EXAM: CHEST - 2 VIEW COMPARISON:  04/26/2015 FINDINGS: Increased prominence of the right hilum compared to 04/26/2015. There is bibasilar atelectasis without focal consolidation. No pleural effusion or pneumothorax. Diffuse soft tissue nodularity is unchanged. IMPRESSION: 1. Increased right hilar prominence relative to the prior chest radiograph. This may be due to differences in position/rotation, but chest CT should be considered for better characterization. 2. No acute airspace disease. Electronically Signed   By: Ulyses Jarred M.D.   On: 01/28/2019 20:29   Ct Head Wo Contrast Result Date: 01/28/2019 CLINICAL DATA:  Altered mental status. Possible aphasia. EXAM: CT HEAD WITHOUT CONTRAST TECHNIQUE: Contiguous axial images were obtained from the base of the skull through the vertex without intravenous contrast. COMPARISON:  03/11/2014 FINDINGS: Brain: Diffuse cerebral atrophy. Ventricular dilatation consistent with central atrophy. Low-attenuation changes in the deep white matter consistent with small vessel ischemia. No mass-effect or midline shift. No abnormal extra-axial fluid collections. Gray-white matter junctions are distinct. Basal cisterns are not effaced. No acute intracranial hemorrhage. Vascular: Moderate intracranial arterial vascular calcifications. Skull: Calvarium appears intact.  Sinuses/Orbits: Paranasal sinuses and mastoid air cells are clear. Other: None. IMPRESSION: No acute intracranial abnormalities. Chronic atrophy and small vessel ischemic changes. Electronically Signed   By: Lucienne Capers M.D.   On: 01/28/2019 20:06    Results for BEAU, VANDUZER (MRN [id number]) as of 01/28/2019 21:25  Ref. Range 08/07/2009 05:28 08/08/2009 05:20 09/27/2009 11:56 01/28/2019 18:33  Sodium Latest Ref Range: 135 - 145 mmol/L 123 (L) 128 (L) 129 (L) 130 (L)      2050:  Na low, but no recent old to compare to other than 2010 labs. Judicious IVF given. No AMS while in the ED, family states pt is at her baseline. Question seizure (vs syncope) as cause for symptoms. T/C returned from [facility] Dr. Derrill Kay, case discussed, including:  HPI, pertinent PM/SHx, VS/PE, dx testing, ED course and treatment:  Agrees symptoms may be seizure, given pt is at her baseline, do not need to load/start meds at this time, pt will need admit for further workup including MRI brain and EEG.   2105:  Dx and testing d/w pt and family.  Questions answered.  Verb understanding, agreeable to admit. T/C returned from [facility] Dr. Clementeen Graham, case discussed, including:  HPI, pertinent PM/SHx, VS/PE, dx testing, ED course and treatment, as well as d/w Neuro MD:  Agreeable to admit.     Final Clinical Impressions(s) / ED Diagnoses   Final diagnoses:  None    ED Discharge Orders    None       Francine Graven, DO 01/29/19 2222

## 2019-01-29 ENCOUNTER — Observation Stay (HOSPITAL_COMMUNITY): Payer: Medicare Other

## 2019-01-29 ENCOUNTER — Observation Stay (HOSPITAL_COMMUNITY)
Admit: 2019-01-29 | Discharge: 2019-01-29 | Disposition: A | Discharge status: Home / Self Care | Payer: Medicare Other | Attending: Internal Medicine | Admitting: Internal Medicine

## 2019-01-29 DIAGNOSIS — G9341 Metabolic encephalopathy: Secondary | ICD-10-CM

## 2019-01-29 DIAGNOSIS — I639 Cerebral infarction, unspecified: Secondary | ICD-10-CM | POA: Diagnosis not present

## 2019-01-29 DIAGNOSIS — E871 Hypo-osmolality and hyponatremia: Secondary | ICD-10-CM | POA: Diagnosis not present

## 2019-01-29 DIAGNOSIS — R4182 Altered mental status, unspecified: Secondary | ICD-10-CM | POA: Diagnosis not present

## 2019-01-29 DIAGNOSIS — I6523 Occlusion and stenosis of bilateral carotid arteries: Secondary | ICD-10-CM | POA: Diagnosis not present

## 2019-01-29 DIAGNOSIS — G454 Transient global amnesia: Secondary | ICD-10-CM | POA: Diagnosis not present

## 2019-01-29 DIAGNOSIS — R569 Unspecified convulsions: Secondary | ICD-10-CM | POA: Diagnosis not present

## 2019-01-29 LAB — LIPID PANEL
Cholesterol: 147 mg/dL (ref 0–200)
HDL: 51 mg/dL (ref >40)
LDL Cholesterol: 84 mg/dL (ref 0–99)
Total CHOL/HDL Ratio: 2.9 RATIO
Triglycerides: 62 mg/dL (ref <150)
VLDL: 12 mg/dL (ref 0–40)

## 2019-01-29 LAB — HEMOGLOBIN A1C
Hgb A1c MFr Bld: 5.1 % (ref 4.8–5.6)
Mean Plasma Glucose: 99.67 mg/dL

## 2019-01-29 MED ORDER — HYDRALAZINE HCL 20 MG/ML IJ SOLN: 10.0000 mg | INTRAMUSCULAR | Status: DC | PRN

## 2019-01-29 MED ORDER — LORAZEPAM 2 MG/ML IJ SOLN
INTRAMUSCULAR | Status: AC
  Administered 2019-01-29: 1 mg via INTRAVENOUS
  Filled 2019-01-29: qty 1

## 2019-01-29 MED ORDER — ENOXAPARIN SODIUM 40 MG/0.4ML ~~LOC~~ SOLN
40.0000 mg | SUBCUTANEOUS | Status: DC
  Administered 2019-01-29 – 2019-01-30 (×2): 40 mg via SUBCUTANEOUS
  Filled 2019-01-29 (×2): qty 0.4

## 2019-01-29 MED ORDER — ACETAMINOPHEN 325 MG PO TABS
650.0000 mg | ORAL_TABLET | Freq: Four times a day (QID) | ORAL | Status: DC | PRN
  Administered 2019-01-29: 650 mg via ORAL
  Filled 2019-01-29: qty 2

## 2019-01-29 MED ORDER — CLOPIDOGREL BISULFATE 75 MG PO TABS
75.0000 mg | ORAL_TABLET | Freq: Every day | ORAL | Status: DC
  Administered 2019-01-30: 75 mg via ORAL
  Filled 2019-01-29: qty 1

## 2019-01-29 MED ORDER — LORAZEPAM 2 MG/ML IJ SOLN: 1.0000 mg | INTRAMUSCULAR | Status: DC | PRN

## 2019-01-29 MED ORDER — ONDANSETRON HCL 4 MG/2ML IJ SOLN: 4.0000 mg | Freq: Four times a day (QID) | INTRAMUSCULAR | Status: DC | PRN

## 2019-01-29 MED ORDER — STROKE: EARLY STAGES OF RECOVERY BOOK: Freq: Once | Status: DC

## 2019-01-29 MED ORDER — ACETAMINOPHEN 650 MG RE SUPP: 650.0000 mg | Freq: Four times a day (QID) | RECTAL | Status: DC | PRN

## 2019-01-29 MED ORDER — ONDANSETRON HCL 4 MG PO TABS: 4.0000 mg | ORAL_TABLET | Freq: Four times a day (QID) | ORAL | Status: DC | PRN

## 2019-01-29 NOTE — Consult Note (Addendum)
[place] A. Merlene Laughter, MD     [email or web address]          Tiffany Shannon is an 85 y.o. female.   ASSESSMENT/PLAN: 1.  Acute mental status changes likely due to acute left frontal watershed infarct: Risk factors age and hypertension.  I suspect that the cause of the patient's stroke is from significant atherosclerotic disease involving the extracranial carotid.  The degree however does not warrant procedural interventional management.  I agree with aspirin 325.  I think we should add Plavix 75 mg daily for 3 months.  Subsequently, single agent aspirin 325 is recommended.  We will follow the echocardiography.  Follow-up EEG especially since the semiology suggest complex partial seizure.  Agree with physical and occupational therapies.  2.  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  Neurofibromatosis type I    The patient is a 85 year old white female who presents with the acute onset of staring and unresponsiveness.  It appears that the spell lasted for about 10 minutes.  She apparently seemed to return to baseline.  At baseline the patient is relatively intact cognitively.  She is typically lucid.  She does have rather severe physical immobility thought to be due to severe osteoarthritis.  She apparently can barely stand.  She does transfer with help from her bed to the chair and to the restroom.  Her husband helps her with movements.  She is able to feed herself but needs help with getting dressed.  The review of systems otherwise negative.  GENERAL: This a pleasant thin female who is in no acute distress.  HEENT: Neck is supple no trauma appreciated.  ABDOMEN: soft  EXTREMITIES: There is some marked arthritic changes of the knees.  There is some swelling of the lower extremities.  BACK: Normal  SKIN: There are numerous fibromas involving the facial region and upper and lower extremities.    MENTAL STATUS: Alert and oriented she is  oriented to her age and the month. Speech, language and cognition are generally intact. Judgment and insight normal.   CRANIAL NERVES: Pupils are equal, round and reactive to light and accomodation; extra ocular movements are full, there is no significant nystagmus; visual fields are full; upper and lower facial muscles are normal in strength and symmetric, there is no flattening of the nasolabial folds; tongue is midline; uvula is midline; shoulder elevation is normal.  MOTOR: She has 4/5 strength in upper extremities.  There is no drift of upper extremities.  Leg strength is graded as 2/5.Marland Kitchen  COORDINATION: Left finger to nose is normal, right finger to nose is normal, No rest tremor; no intention tremor; no postural tremor; no bradykinesia.  REFLEXES: Deep tendon reflexes are symmetrical and normal. Plantar reflexes are flexor bilaterally.   SENSATION: Normal to light pain.   NIH stroke scale 3, 3 total 6.  Blood pressure 138/85, pulse 80, temperature 97.9 F (36.6 C), temperature source Oral, resp. rate (!) 21, height 5' (1.524 m), weight 58.5 kg, SpO2 99 %.  Past Medical History:  Diagnosis Date  . Hypertension     Past Surgical History:  Procedure Laterality Date  . ABDOMINAL HYSTERECTOMY    . APPENDECTOMY    . TONSILLECTOMY      History reviewed. No pertinent family history.  Social History:  reports that she has never smoked. She has never used smokeless tobacco. She reports that she does not drink alcohol or use drugs.  Allergies:  Allergies  Allergen Reactions  .  Codeine Nausea And Vomiting    Medications: Prior to Admission medications   Medication Sig Start Date End Date Taking? Authorizing Provider  aspirin EC 81 MG tablet Take 81 mg by mouth daily.   Yes [provider]    Scheduled Meds: . aspirin  325 mg Oral Daily  . enoxaparin (LOVENOX) injection  40 mg Subcutaneous Q24H   Continuous Infusions: . sodium chloride 75 mL/hr at 01/29/19 0240    PRN Meds:.acetaminophen **OR** acetaminophen, hydrALAZINE, LORazepam, ondansetron **OR** ondansetron (ZOFRAN) IV     Results for orders placed or performed during the hospital encounter of 01/28/19 (from the past 48 hour(s))  CBG monitoring, ED     Status: Abnormal   Collection Time: 01/28/19  6:29 PM  Result Value Ref Range   Glucose-Capillary 111 (H) 70 - 99 mg/dL  Ethanol     Status: None   Collection Time: 01/28/19  6:33 PM  Result Value Ref Range   Alcohol, Ethyl (B) <10 <10 mg/dL    Comment: (NOTE) Lowest detectable limit for serum alcohol is 10 mg/dL. For medical purposes only. Performed at [facility] [address], [place], [place] [address]   Protime-INR     Status: None   Collection Time: 01/28/19  6:33 PM  Result Value Ref Range   Prothrombin Time 14.0 11.4 - 15.2 seconds   INR 1.1 0.8 - 1.2    Comment: (NOTE) INR goal varies based on device and disease states. Performed at [facility], [address] [place], [place] [address]   APTT     Status: Abnormal   Collection Time: 01/28/19  6:33 PM  Result Value Ref Range   aPTT 43 (H) 24 - 36 seconds    Comment:        IF BASELINE aPTT IS ELEVATED, SUGGEST PATIENT RISK ASSESSMENT BE USED TO DETERMINE APPROPRIATE ANTICOAGULANT THERAPY. Performed at [facility], [address], [place], [place] [address]   CBC     Status: Abnormal   Collection Time: 01/28/19  6:33 PM  Result Value Ref Range   WBC 14.7 (H) 4.0 - 10.5 K/uL   RBC 4.61 3.87 - 5.11 MIL/uL   Hemoglobin 13.9 12.0 - 15.0 g/dL   HCT 44.1 36.0 - 46.0 %   MCV 95.7 80.0 - 100.0 fL   MCH 30.2 26.0 - 34.0 pg   MCHC 31.5 30.0 - 36.0 g/dL   RDW 14.2 11.5 - 15.5 %   Platelets 371 150 - 400 K/uL   nRBC 0.0 0.0 - 0.2 %    Comment: Performed at [facility], [address] [place], [place] [address]  Differential     Status: Abnormal   Collection Time: 01/28/19  6:33 PM  Result Value Ref Range   Neutrophils Relative % 74 %   Neutro Abs 10.9 (H) 1.7 - 7.7  K/uL   Lymphocytes Relative 17 %   Lymphs Abs 2.6 0.7 - 4.0 K/uL   Monocytes Relative 6 %   Monocytes Absolute 0.9 0.1 - 1.0 K/uL   Eosinophils Relative 2 %   Eosinophils Absolute 0.3 0.0 - 0.5 K/uL   Basophils Relative 0 %   Basophils Absolute 0.0 0.0 - 0.1 K/uL   Immature Granulocytes 1 %   Abs Immature Granulocytes 0.07 0.00 - 0.07 K/uL    Comment: Performed at [facility], [address], [place], [place] [address]  Comprehensive metabolic panel     Status: Abnormal   Collection Time: 01/28/19  6:33 PM  Result  Value Ref Range   Sodium 130 (L) 135 - 145 mmol/L   Potassium 4.0 3.5 - 5.1 mmol/L   Chloride 99 98 - 111 mmol/L   CO2 22 22 - 32 mmol/L   Glucose, Bld 117 (H) 70 - 99 mg/dL   BUN 19 8 - 23 mg/dL   Creatinine, Ser 0.63 0.44 - 1.00 mg/dL   Calcium 8.3 (L) 8.9 - 10.3 mg/dL   Total Protein 7.4 6.5 - 8.1 g/dL   Albumin 4.4 3.5 - 5.0 g/dL   AST 27 15 - 41 U/L   ALT 20 0 - 44 U/L   Alkaline Phosphatase 75 38 - 126 U/L   Total Bilirubin 0.7 0.3 - 1.2 mg/dL   GFR calc non Af Amer >60 >60 mL/min   GFR calc Af Amer >60 >60 mL/min   Anion gap 9 5 - 15    Comment: Performed at [facility], [address] [place], [place] [address]  Troponin I - ONCE - STAT     Status: None   Collection Time: 01/28/19  6:33 PM  Result Value Ref Range   Troponin I <0.03 <0.03 ng/mL    Comment: Performed at [facility], [address] [place], [place] [address]  TSH     Status: None   Collection Time: 01/28/19  6:33 PM  Result Value Ref Range   TSH 4.144 0.350 - 4.500 uIU/mL    Comment: Performed by a 3rd Generation assay with a functional sensitivity of <=0.01 uIU/mL. Performed at [facility], [address], [place], [place] [address]   Urine rapid drug screen (hosp performed)not at [facility]     Status: None   Collection Time: 01/28/19  7:24 PM  Result Value Ref Range   Opiates NONE DETECTED NONE DETECTED   Cocaine NONE DETECTED NONE DETECTED   Benzodiazepines NONE DETECTED NONE DETECTED    Amphetamines NONE DETECTED NONE DETECTED   Tetrahydrocannabinol NONE DETECTED NONE DETECTED   Barbiturates NONE DETECTED NONE DETECTED    Comment: (NOTE) DRUG SCREEN FOR MEDICAL PURPOSES ONLY.  IF CONFIRMATION IS NEEDED FOR ANY PURPOSE, NOTIFY LAB WITHIN 5 DAYS. LOWEST DETECTABLE LIMITS FOR URINE DRUG SCREEN Drug Class                     Cutoff (ng/mL) Amphetamine and metabolites    1000 Barbiturate and metabolites    200 Benzodiazepine                 165 Tricyclics and metabolites     300 Opiates and metabolites        300 Cocaine and metabolites        300 THC                            50 Performed at [facility], [address] [place], [place] [address]   Urinalysis, Routine w reflex microscopic     Status: Abnormal   Collection Time: 01/28/19  7:24 PM  Result Value Ref Range   Color, Urine YELLOW YELLOW   APPearance HAZY (A) CLEAR   Specific Gravity, Urine 1.016 1.005 - 1.030   pH 5.0 5.0 - 8.0   Glucose, UA NEGATIVE NEGATIVE mg/dL   Hgb urine dipstick NEGATIVE NEGATIVE   Bilirubin Urine NEGATIVE NEGATIVE   Ketones, ur 5 (A) NEGATIVE mg/dL   Protein, ur 30 (A) NEGATIVE mg/dL   Nitrite NEGATIVE NEGATIVE   Leukocytes,Ua NEGATIVE NEGATIVE  WBC, UA 0-5 0 - 5 WBC/hpf   Bacteria, UA RARE (A) NONE SEEN   Squamous Epithelial / LPF 0-5 0 - 5    Comment: Performed at [facility], [address] [place], [place] [address]  Lipid panel     Status: None   Collection Time: 01/29/19  3:12 PM  Result Value Ref Range   Cholesterol 147 0 - 200 mg/dL   Triglycerides 62 <150 mg/dL   HDL 51 >40 mg/dL   Total CHOL/HDL Ratio 2.9 RATIO   VLDL 12 0 - 40 mg/dL   LDL Cholesterol 84 0 - 99 mg/dL    Comment:        Total Cholesterol/HDL:CHD Risk Coronary Heart Disease Risk Table                     Men   Women  1/2 Average Risk   3.4   3.3  Average Risk       5.0   4.4  2 X Average Risk   9.6   7.1  3 X Average Risk  23.4   11.0        Use the calculated Patient Ratio above  and the CHD Risk Table to determine the patient's CHD Risk.        ATP III CLASSIFICATION (LDL):  <100     mg/dL   Optimal  100-129  mg/dL   Near or Above                    Optimal  130-159  mg/dL   Borderline  160-189  mg/dL   High  >190     mg/dL   Very High Performed at [place] [place], [place] [address]     Studies/Results:   BRAIN MRI MRA FINDINGS: MRI HEAD FINDINGS  Brain: Diffusion imaging shows a number small watershed distribution infarctions within the white matter of the left cerebral hemisphere from the frontal parietal region. No evidence of hemorrhage, swelling or mass effect. Brainstem and cerebellum are unremarkable. Cerebral hemispheres elsewhere show chronic small-vessel ischemic changes throughout the deep and subcortical white matter. No mass lesion hydrocephalus or extra-axial collection.  Vascular: Major vessels at the base of the brain show flow.  Skull and upper cervical spine: Negative  Sinuses/Orbits: Clear/normal  Other: None  MRA HEAD FINDINGS  Both internal carotid arteries are patent through the skull base and siphon regions. The anterior and middle cerebral vessels are patent. Motion degradation does not allow accurate evaluation of the more distal branch vessels. There is potential stenosis or occlusion of the left anterior cerebral artery. There appear to be branch vessel stenoses within the middle cerebral artery branches.  Both vertebral arteries are patent to the basilar. No flow limiting basilar stenosis. Posterior circulation branch vessels show flow. Left PCA takes fetal origin the anterior circulation.  IMPRESSION: Acute watershed infarctions within the deep white matter of the left cerebral hemisphere. No swelling or hemorrhage.  Intracranial MR angiography suffers from motion degradation. There is potential for severe stenosis or occlusion of the left anterior cerebral artery. There also  may be multiple stenoses more distal MCA branch vessels, but confidence is not high given motion degradation.      CAROTID RIGHT  ICA: 76/11 cm/sec  CCA: 23/30 cm/sec  SYSTOLIC ICA/CCA RATIO:  0.5  ECA: 115 cm/sec  LEFT  ICA: 93/17 cm/sec  CCA: 07/62 cm/sec  SYSTOLIC ICA/CCA RATIO:  0.8  ECA: 134  cm/sec  RIGHT CAROTID ARTERY: There is a large amount of eccentric echogenic plaque within the right carotid bulb (images 14 and 16) with minimal to moderate amount of eccentric echogenic plaque involving the origin and proximal aspects of the right internal carotid artery (image 24), likely progressed compared to remote examination performed 01/2010 though again not resulting in elevated peak systolic velocities within the interrogated course of the right internal carotid artery to suggest a hemodynamically significant stenosis.  RIGHT VERTEBRAL ARTERY:  Antegrade flow  LEFT CAROTID ARTERY: There is a large amount of eccentric densely shadowing atherosclerotic plaque within the left carotid bulb (images 48 and 50), extending to involve the origin and proximal aspect of the left internal carotid artery (image 58), likely progressed compared to the 01/2010 examination, though again not resulting in elevated peak systolic velocities within the interrogated course of the left internal carotid artery to suggest a hemodynamically significant stenosis.  LEFT VERTEBRAL ARTERY:  Antegrade flow  IMPRESSION: Large amount of bilateral atherosclerotic plaque, left greater than right, likely progressed compared to remote examination performed 01/2010, though again not resulting in elevated peak systolic velocities within either internal carotid artery to suggest a hemodynamically significant stenosis.       TTE----P       THE BRAIN MRI SCAN IS REVIEWED IN PERSON.  In her off few small infarct involving the paracentral region on the left side in the  typical watershed distribution.  There is marked global atrophy.  There is moderate periventricular leukoencephalopathy consistent with chronic microvascular changes.  No hemorrhages appreciated.  No focal encephalomalacia is appreciated.  MRA shows no significant intracranial disease.  Machi Whittaker A. Merlene Laughter, M.D.  Diplomate, Tax adviser of Psychiatry and Neurology ( Neurology). 01/29/2019, 5:33 PM

## 2019-01-29 NOTE — ED Notes (Signed)
Patient re-positioned and turned.

## 2019-01-29 NOTE — Care Management Obs Status (Signed)
[place] NOTIFICATION   Patient Details  Name: Tiffany Shannon MRN: [id number] Date of Birth: 04-23-1923   Medicare Observation Status Notification Given:  Yes    Shade Flood, LCSW 01/29/2019, 12:11 PM

## 2019-01-29 NOTE — Procedures (Signed)
ELECTROENCEPHALOGRAM REPORT   Patient: Tiffany Shannon [facility]       Room #: IC11 EEG No. ID: [id number] Age: 85 y.o.        Sex: female Referring Physician: Manuella Ghazi Report Date:  01/29/2019        Interpreting Physician: Alexis Goodell  History: [name] is an 85 y.o. female presenting after an episode of unresponsiveness  Medications:  ASA  Conditions of Recording:  This is a 21 channel routine scalp EEG performed with bipolar and monopolar montages arranged in accordance to the international 10/20 system of electrode placement. One channel was dedicated to EKG recording.  The patient is in the awake and drowsy states.  Description:  The waking background activity consists of a low voltage, symmetrical, fairly well organized, 9-10 Hz alpha activity, seen from the parieto-occipital and posterior temporal regions.  Low voltage fast activity, poorly organized, is seen anteriorly and is at times superimposed on more posterior regions.  A mixture of theta and alpha rhythms are seen from the central and temporal regions. The patient drowses with slowing to irregular, low voltage theta and beta activity.   Stage II sleep is not obtained. No epileptiform activity is noted.   Hyperventilation and intermittent photic stimulation were not performed.   IMPRESSION: Normal electroencephalogram, awake and drowsy. There are no focal lateralizing or epileptiform features.   Alexis Goodell, MD Neurology [phone] 01/29/2019, 6:26 PM

## 2019-01-29 NOTE — Progress Notes (Signed)
PROGRESS NOTE    Tiffany Shannon  JEH:[id number] DOB: 12-19-1922 DOA: 01/28/2019 PCP: Tiffany Gravel, MD   Brief Narrative:  Per HPI:  Tiffany Shannon  is a 85 y.o. female, with history of hypertension, wheelchair-bound who was brought to the ED by family after she was found to stop responding for almost 10 minutes this evening.  [place] when trying to get out of the car patient stopped responding to any questions.  She took off her sunglasses and then with just tearing, not responding to any questions.  She was " staring off" and making chewing motions clicking her teeth".  After about 10 minutes she started to respond and family drove her to the ED.  Within 5 minutes after that she was communicating normally and said that she " just felt hot".  Family did not notice any bowel or urinary incontinence. Denies any headache, blurred vision, dizziness, nausea, vomiting, cough, chest pain, shortness of breath, abdominal pain, dysuria or diarrhea.  Denies any tingling numbness or weakness of her extremities. No recent fall or head trauma.  No recent fevers, chills, recent illness, sick contact or recent travel.  No changes in her medication (reportedly takes blood pressure medication with the family does not remember). Patient is wheelchair-bound at baseline and lives with her elderly husband who helps with her ADLs. At baseline she is alert and oriented and able to carry out normal conversations.  Patient was admitted with transient global amnesia.  MRI currently reveals acute CVA in the watershed region of the left cerebral hemisphere.  Assessment & Plan:   Active Problems:   Acute metabolic encephalopathy   Transient global amnesia   Essential hypertension   Hyponatremia  1. Transient global amnesia likely secondary to left-sided CVA.  Continue on full dose aspirin and withhold any further antihypertensives and allow permissive hypertension.  Maintain on IV normal  saline.  EEG pending.  Neurology consulted for further recommendations.  Will ensure that hemoglobin A1c and fasting lipid profile are ordered as well as 2D echocardiogram and carotid ultrasounds.  Continue telemetry monitoring. 2. Essential hypertension.  Hold home blood pressure medications with IV hydralazine adjusted for severe elevations. 3. Chronic hyponatremia.  Repeat labs in a.m.  TSH within normal limits. 4. Leukocytosis.  Recheck CBC in a.m.  No clinical signs of infection at this point in time.  DVT prophylaxis: Lovenox Code Status: Full Family Communication: Husband, son, and daughter at bedside Disposition Plan: Continue monitoring further evaluation with EEG as ordered.  Neurology consulted for evaluation.   Consultants:   Neurology  Procedures:   MRI/MRA on 3/12  EEG pending  Antimicrobials:   None   Subjective: Patient seen and evaluated today with no new acute complaints or concerns. No acute concerns or events noted overnight.  She appears to be able to eat her diet with minimal to no coughing or choking.  She appears to be at her usual baseline per family members.  Objective: Vitals:   01/29/19 1021 01/29/19 1200 01/29/19 1300 01/29/19 1400  BP: (!) 146/81 (!) 96/53 (!) 144/75 138/85  Pulse: 96 95 87 80  Resp:  (!) 28  (!) 21  Temp: 99.7 F (37.6 C)     TempSrc: Oral     SpO2: 100% 97% 98% 99%  Weight: 58.5 kg     Height: 5' (1.524 m)      No intake or output data in the 24 hours ending 01/29/19 [place]  01/28/19 1815 01/29/19 1021  Weight: 63.5 kg 58.5 kg    Examination:  General exam: Appears calm and comfortable  Respiratory system: Clear to auscultation. Respiratory effort normal. Cardiovascular system: S1 & S2 heard, RRR. No JVD, murmurs, rubs, gallops or clicks. No pedal edema. Gastrointestinal system: Abdomen is nondistended, soft and nontender. No organomegaly or masses felt. Normal bowel sounds heard. Central nervous  system: Difficult to evaluate due to patient cooperation being minimal. Extremities: Symmetric 5 x 5 power. Skin: No rashes, lesions or ulcers Psychiatry: Difficult to assess given patient condition.    Data Reviewed: I have personally reviewed following labs and imaging studies  CBC: Recent Labs  Lab 01/28/19 1833  WBC 14.7*  NEUTROABS 10.9*  HGB 13.9  HCT 44.1  MCV 95.7  PLT 160   Basic Metabolic Panel: Recent Labs  Lab 01/28/19 1833  NA 130*  K 4.0  CL 99  CO2 22  GLUCOSE 117*  BUN 19  CREATININE 0.63  CALCIUM 8.3*   GFR: Estimated Creatinine Clearance: 35.2 mL/min (by C-G formula based on SCr of 0.63 mg/dL). Liver Function Tests: Recent Labs  Lab 01/28/19 1833  AST 27  ALT 20  ALKPHOS 75  BILITOT 0.7  PROT 7.4  ALBUMIN 4.4   No results for input(s): LIPASE, AMYLASE in the last 168 hours. No results for input(s): AMMONIA in the last 168 hours. Coagulation Profile: Recent Labs  Lab 01/28/19 1833  INR 1.1   Cardiac Enzymes: Recent Labs  Lab 01/28/19 1833  TROPONINI <0.03   BNP (last 3 results) No results for input(s): PROBNP in the last 8760 hours. HbA1C: No results for input(s): HGBA1C in the last 72 hours. CBG: Recent Labs  Lab 01/28/19 1829  GLUCAP 111*   Lipid Profile: No results for input(s): CHOL, HDL, LDLCALC, TRIG, CHOLHDL, LDLDIRECT in the last 72 hours. Thyroid Function Tests: Recent Labs    01/28/19 1833  TSH 4.144   Anemia Panel: No results for input(s): VITAMINB12, FOLATE, FERRITIN, TIBC, IRON, RETICCTPCT in the last 72 hours. Sepsis Labs: No results for input(s): PROCALCITON, LATICACIDVEN in the last 168 hours.  No results found for this or any previous visit (from the past 240 hour(s)).       Radiology Studies: Dg Chest 2 View  Result Date: 01/28/2019 CLINICAL DATA:  Altered mental status EXAM: CHEST - 2 VIEW COMPARISON:  04/26/2015 FINDINGS: Increased prominence of the right hilum compared to 04/26/2015.  There is bibasilar atelectasis without focal consolidation. No pleural effusion or pneumothorax. Diffuse soft tissue nodularity is unchanged. IMPRESSION: 1. Increased right hilar prominence relative to the prior chest radiograph. This may be due to differences in position/rotation, but chest CT should be considered for better characterization. 2. No acute airspace disease. Electronically Signed   By: Ulyses Jarred M.D.   On: 01/28/2019 20:29   Ct Head Wo Contrast  Result Date: 01/28/2019 CLINICAL DATA:  Altered mental status. Possible aphasia. EXAM: CT HEAD WITHOUT CONTRAST TECHNIQUE: Contiguous axial images were obtained from the base of the skull through the vertex without intravenous contrast. COMPARISON:  03/11/2014 FINDINGS: Brain: Diffuse cerebral atrophy. Ventricular dilatation consistent with central atrophy. Low-attenuation changes in the deep white matter consistent with small vessel ischemia. No mass-effect or midline shift. No abnormal extra-axial fluid collections. Gray-white matter junctions are distinct. Basal cisterns are not effaced. No acute intracranial hemorrhage. Vascular: Moderate intracranial arterial vascular calcifications. Skull: Calvarium appears intact. Sinuses/Orbits: Paranasal sinuses and mastoid air cells are clear. Other: None. IMPRESSION: No acute  intracranial abnormalities. Chronic atrophy and small vessel ischemic changes. Electronically Signed   By: Lucienne Capers M.D.   On: 01/28/2019 20:06   Mr Jodene Nam Head Wo Contrast  Result Date: 01/29/2019 CLINICAL DATA:  Altered mental status.  Unresponsive. EXAM: MRI HEAD WITHOUT CONTRAST MRA HEAD WITHOUT CONTRAST TECHNIQUE: Multiplanar, multiecho pulse sequences of the brain and surrounding structures were obtained without intravenous contrast. Angiographic images of the head were obtained using MRA technique without contrast. COMPARISON:  Head CT same day.  MRI 05/09/2006. FINDINGS: MRI HEAD FINDINGS Brain: Diffusion imaging shows  a number small watershed distribution infarctions within the white matter of the left cerebral hemisphere from the frontal parietal region. No evidence of hemorrhage, swelling or mass effect. Brainstem and cerebellum are unremarkable. Cerebral hemispheres elsewhere show chronic small-vessel ischemic changes throughout the deep and subcortical white matter. No mass lesion hydrocephalus or extra-axial collection. Vascular: Major vessels at the base of the brain show flow. Skull and upper cervical spine: Negative Sinuses/Orbits: Clear/normal Other: None MRA HEAD FINDINGS Both internal carotid arteries are patent through the skull base and siphon regions. The anterior and middle cerebral vessels are patent. Motion degradation does not allow accurate evaluation of the more distal branch vessels. There is potential stenosis or occlusion of the left anterior cerebral artery. There appear to be branch vessel stenoses within the middle cerebral artery branches. Both vertebral arteries are patent to the basilar. No flow limiting basilar stenosis. Posterior circulation branch vessels show flow. Left PCA takes fetal origin the anterior circulation. IMPRESSION: Acute watershed infarctions within the deep white matter of the left cerebral hemisphere. No swelling or hemorrhage. Intracranial MR angiography suffers from motion degradation. There is potential for severe stenosis or occlusion of the left anterior cerebral artery. There also may be multiple stenoses more distal MCA branch vessels, but confidence is not high given motion degradation. Electronically Signed   By: Nelson Chimes M.D.   On: 01/29/2019 13:10   Mr Brain Wo Contrast  Result Date: 01/29/2019 CLINICAL DATA:  Altered mental status.  Unresponsive. EXAM: MRI HEAD WITHOUT CONTRAST MRA HEAD WITHOUT CONTRAST TECHNIQUE: Multiplanar, multiecho pulse sequences of the brain and surrounding structures were obtained without intravenous contrast. Angiographic images of the  head were obtained using MRA technique without contrast. COMPARISON:  Head CT same day.  MRI 05/09/2006. FINDINGS: MRI HEAD FINDINGS Brain: Diffusion imaging shows a number small watershed distribution infarctions within the white matter of the left cerebral hemisphere from the frontal parietal region. No evidence of hemorrhage, swelling or mass effect. Brainstem and cerebellum are unremarkable. Cerebral hemispheres elsewhere show chronic small-vessel ischemic changes throughout the deep and subcortical white matter. No mass lesion hydrocephalus or extra-axial collection. Vascular: Major vessels at the base of the brain show flow. Skull and upper cervical spine: Negative Sinuses/Orbits: Clear/normal Other: None MRA HEAD FINDINGS Both internal carotid arteries are patent through the skull base and siphon regions. The anterior and middle cerebral vessels are patent. Motion degradation does not allow accurate evaluation of the more distal branch vessels. There is potential stenosis or occlusion of the left anterior cerebral artery. There appear to be branch vessel stenoses within the middle cerebral artery branches. Both vertebral arteries are patent to the basilar. No flow limiting basilar stenosis. Posterior circulation branch vessels show flow. Left PCA takes fetal origin the anterior circulation. IMPRESSION: Acute watershed infarctions within the deep white matter of the left cerebral hemisphere. No swelling or hemorrhage. Intracranial MR angiography suffers from motion degradation. There is potential  for severe stenosis or occlusion of the left anterior cerebral artery. There also may be multiple stenoses more distal MCA branch vessels, but confidence is not high given motion degradation. Electronically Signed   By: Nelson Chimes M.D.   On: 01/29/2019 13:10        Scheduled Meds:  aspirin  325 mg Oral Daily   enoxaparin (LOVENOX) injection  40 mg Subcutaneous Q24H   Continuous Infusions:  sodium  chloride 75 mL/hr at 01/29/19 0240     LOS: 0 days    Time spent: 30 minutes    Leeta Grimme Darleen Crocker, DO [facility] Pager [phone]  If 7PM-7AM, please contact night-coverage [email or web address] Password [facility] 01/29/2019, 2:13 PM

## 2019-01-29 NOTE — Progress Notes (Signed)
EEG Completed; Results Pending  

## 2019-01-30 ENCOUNTER — Inpatient Hospital Stay (HOSPITAL_COMMUNITY): Payer: Medicare Other

## 2019-01-30 DIAGNOSIS — I340 Nonrheumatic mitral (valve) insufficiency: Secondary | ICD-10-CM | POA: Diagnosis not present

## 2019-01-30 DIAGNOSIS — G9340 Encephalopathy, unspecified: Secondary | ICD-10-CM | POA: Diagnosis present

## 2019-01-30 DIAGNOSIS — E871 Hypo-osmolality and hyponatremia: Secondary | ICD-10-CM | POA: Diagnosis present

## 2019-01-30 DIAGNOSIS — R4182 Altered mental status, unspecified: Secondary | ICD-10-CM | POA: Diagnosis not present

## 2019-01-30 DIAGNOSIS — Z885 Allergy status to narcotic agent status: Secondary | ICD-10-CM | POA: Diagnosis not present

## 2019-01-30 DIAGNOSIS — M1990 Unspecified osteoarthritis, unspecified site: Secondary | ICD-10-CM | POA: Diagnosis present

## 2019-01-30 DIAGNOSIS — G454 Transient global amnesia: Secondary | ICD-10-CM | POA: Diagnosis present

## 2019-01-30 DIAGNOSIS — D72829 Elevated white blood cell count, unspecified: Secondary | ICD-10-CM | POA: Diagnosis present

## 2019-01-30 DIAGNOSIS — Q8500 Neurofibromatosis, unspecified: Secondary | ICD-10-CM | POA: Diagnosis not present

## 2019-01-30 DIAGNOSIS — I361 Nonrheumatic tricuspid (valve) insufficiency: Secondary | ICD-10-CM | POA: Diagnosis not present

## 2019-01-30 DIAGNOSIS — Z90710 Acquired absence of both cervix and uterus: Secondary | ICD-10-CM | POA: Diagnosis not present

## 2019-01-30 DIAGNOSIS — Z993 Dependence on wheelchair: Secondary | ICD-10-CM | POA: Diagnosis not present

## 2019-01-30 DIAGNOSIS — I459 Conduction disorder, unspecified: Secondary | ICD-10-CM | POA: Diagnosis present

## 2019-01-30 DIAGNOSIS — R29702 NIHSS score 2: Secondary | ICD-10-CM | POA: Diagnosis present

## 2019-01-30 DIAGNOSIS — I10 Essential (primary) hypertension: Secondary | ICD-10-CM | POA: Diagnosis present

## 2019-01-30 DIAGNOSIS — I639 Cerebral infarction, unspecified: Secondary | ICD-10-CM | POA: Diagnosis present

## 2019-01-30 DIAGNOSIS — R29701 NIHSS score 1: Secondary | ICD-10-CM | POA: Diagnosis not present

## 2019-01-30 DIAGNOSIS — G9341 Metabolic encephalopathy: Secondary | ICD-10-CM | POA: Diagnosis present

## 2019-01-30 LAB — BASIC METABOLIC PANEL
Anion gap: 9 (ref 5–15)
BUN: 11 mg/dL (ref 8–23)
CO2: 17 mmol/L — ABNORMAL LOW (ref 22–32)
CREATININE: 0.42 mg/dL — AB (ref 0.44–1.00)
Calcium: 7.4 mg/dL — ABNORMAL LOW (ref 8.9–10.3)
Chloride: 106 mmol/L (ref 98–111)
GFR calc Af Amer: >60 mL/min (ref >60)
GFR calc non Af Amer: >60 mL/min (ref >60)
Glucose, Bld: 119 mg/dL — ABNORMAL HIGH (ref 70–99)
Potassium: 3.9 mmol/L (ref 3.5–5.1)
Sodium: 132 mmol/L — ABNORMAL LOW (ref 135–145)

## 2019-01-30 LAB — CBC
HCT: 40.5 % (ref 36.0–46.0)
Hemoglobin: 13.4 g/dL (ref 12.0–15.0)
MCH: 30.3 pg (ref 26.0–34.0)
MCHC: 33.1 g/dL (ref 30.0–36.0)
MCV: 91.6 fL (ref 80.0–100.0)
PLATELETS: 326 10*3/uL (ref 150–400)
RBC: 4.42 MIL/uL (ref 3.87–5.11)
RDW: 13.6 % (ref 11.5–15.5)
WBC: 12.3 10*3/uL — ABNORMAL HIGH (ref 4.0–10.5)
nRBC: 0 % (ref 0.0–0.2)

## 2019-01-30 LAB — ECHOCARDIOGRAM COMPLETE
Height: 60 in
WEIGHTICAEL: 2063.51 [oz_av]

## 2019-01-30 MED ORDER — SIMVASTATIN 10 MG PO TABS: 10.0000 mg | ORAL_TABLET | Freq: Every day | ORAL | 2 refills | Status: AC

## 2019-01-30 MED ORDER — SIMVASTATIN 20 MG PO TABS: 10.0000 mg | ORAL_TABLET | Freq: Every day | ORAL | Status: DC

## 2019-01-30 MED ORDER — CLOPIDOGREL BISULFATE 75 MG PO TABS: 75.0000 mg | ORAL_TABLET | Freq: Every day | ORAL | 3 refills | Status: AC

## 2019-01-30 MED ORDER — ASPIRIN 325 MG PO TABS: 325.0000 mg | ORAL_TABLET | Freq: Every day | ORAL | 5 refills | Status: AC

## 2019-01-30 NOTE — Progress Notes (Signed)
*  PRELIMINARY RESULTS* Echocardiogram 2D Echocardiogram has been performed.  Leavy Cella 01/30/2019, 10:18 AM

## 2019-01-30 NOTE — Progress Notes (Signed)
Patient is to be discharged home and in stable condition. Patient's husband and daughter at bedside for discharge teaching. Given discharge instructions and family verbalized understanding. Patient's IV removed, WNL. Patient dressed and placed in her personal wheelchair per family request who states that she rides in a wheelchair van. Patient escorted out by staff via wheelchair to awaiting transportation.  Celestia Khat, RN

## 2019-01-30 NOTE — Care Management Important Message (Signed)
Important Message  Patient Details  Name: Tiffany Shannon MRN: [id number] Date of Birth: Jul 27, 1923   Medicare Important Message Given:  Yes    Tommy Medal 01/30/2019, 11:26 AM

## 2019-01-30 NOTE — TOC Initial Note (Signed)
Transition of Care [facility]) - Initial/Assessment Note    Patient Details  Name: Tiffany Shannon MRN: [id number] Date of Birth: 09-27-1923  [facility]) CM/SW Contact:    Shade Flood, LCSW Phone Number: 01/30/2019, 11:47 AM  Clinical Narrative:  PT recommending SNF rehab for pt. Met with pt and family to discuss. Pt's husband is primary caregiver at home. He states that he prefers to take pt home with [facility] PT. Options provided and referral made at husband's request. Updated MD.                 Expected Discharge Plan: [place] Barriers to Discharge: No Barriers Identified   Patient Goals and CMS Choice Patient states their goals for this hospitalization and ongoing recovery are:: home with family support CMS Medicare.gov Compare Post Acute Care list provided to:: Patient Represenative (must comment)(husband) Choice offered to / list presented to : Spouse  Expected Discharge Plan and Services Expected Discharge Plan: [place] Choice: [place] arrangements for the past 2 months: Single Family Home                     HH Arranged: PT [place] Agency: [place] ([facility])  Prior Living Arrangements/Services Living arrangements for the past 2 months: Single Family Home Lives with:: Spouse Patient language and need for interpreter reviewed:: Yes Do you feel safe going back to the place where you live?: Yes      Need for Family Participation in Patient Care: Yes (Comment) Care giver support system in place?: Yes (comment)   Criminal Activity/Legal Involvement Pertinent to Current Situation/Hospitalization: No - Comment as needed  Activities of Daily Living Home Assistive Devices/Equipment: Wheelchair ADL Screening (condition at time of admission) Patient's cognitive ability adequate to safely complete daily activities?: No Is the patient deaf or have difficulty hearing?: No Does the patient have  difficulty seeing, even when wearing glasses/contacts?: No Does the patient have difficulty concentrating, remembering, or making decisions?: Yes Patient able to express need for assistance with ADLs?: Yes Does the patient have difficulty dressing or bathing?: Yes Independently performs ADLs?: No Communication: Needs assistance Is this a change from baseline?: Pre-admission baseline Dressing (OT): Needs assistance Is this a change from baseline?: Pre-admission baseline Grooming: Needs assistance Is this a change from baseline?: Pre-admission baseline Feeding: Needs assistance Is this a change from baseline?: Pre-admission baseline Bathing: Needs assistance Is this a change from baseline?: Pre-admission baseline Toileting: Needs assistance Is this a change from baseline?: Pre-admission baseline In/Out Bed: Needs assistance Is this a change from baseline?: Pre-admission baseline Walks in Home: Needs assistance Is this a change from baseline?: Pre-admission baseline Does the patient have difficulty walking or climbing stairs?: Yes Weakness of Legs: Both Weakness of Arms/Hands: Both  Permission Sought/Granted                  Emotional Assessment Appearance:: Appears stated age Attitude/Demeanor/Rapport: Unable to Assess Affect (typically observed): Calm Orientation: : Oriented to Self Alcohol / Substance Use: Not Applicable Psych Involvement: No (comment)  Admission diagnosis:  Transient alteration of awareness [R40.4] Hyponatremia [E87.1] Transient global amnesia [G45.4] Patient Active Problem List   Diagnosis Date Noted  . Acute encephalopathy 01/30/2019  . Acute metabolic encephalopathy 93/23/5573  . Transient global amnesia 01/28/2019  . Essential hypertension 01/28/2019  . Hyponatremia 01/28/2019   PCP:  [name], MD Pharmacy:   [facility] -  [place], [place] - [address] [place] [address] Phone: [phone]  Fax: [phone]     Social Determinants of Health (SDOH) Interventions    Readmission Risk Interventions 30 Day Unplanned Readmission Risk Score     ED to Hosp-Admission (Current) from 01/28/2019 in [place]  30 Day Unplanned Readmission Risk Score (%)  10 Filed at 01/30/2019 0801     This score is the patient's risk of an unplanned readmission within 30 days of being discharged (0 -100%). The score is based on dignosis, age, lab data, medications, orders, and past utilization.   Low:  0-14.9   Medium: 15-21.9   High: 22-29.9   Extreme: 30 and above       No flowsheet data found.

## 2019-01-30 NOTE — Evaluation (Signed)
Physical Therapy Evaluation Patient Details Name: Tiffany Shannon MRN: [id number] DOB: 11/09/1923 Today's Date: 01/30/2019   History of Present Illness  Tiffany Shannon  is a 85 y.o. female, with history of hypertension, wheelchair-bound who was brought to the ED by family after she was found to stop responding for almost 10 minutes this evening.  Family was [place] to [place] when trying to get out of the car patient stopped responding to any questions.  She took off her sunglasses and then with just tearing, not responding to any questions.  She was " staring off" and making chewing motions clicking her teeth".  After about 10 minutes she started to respond and family drove her to the ED.  Within 5 minutes after that she was communicating normally and said that she " just felt hot".  Family did not notice any bowel or urinary incontinence.    Clinical Impression  Patient functioning near baseline for functional mobility and gait, patient has good BUE strength for sit to stands, able to fully extend legs during sit to stands, but limited for standing due to weakness, patient's family present and her spouse present for transfer training and states that patient is close to baseline.  Patient to bed discharged home to day and discharged from physical therapy to care of nursing for out of bed daily as tolerated for length of stay.     Follow Up Recommendations Home health PT;Supervision/Assistance - 24 hour;Supervision for mobility/OOB    Equipment Recommendations  None recommended by PT    Recommendations for Other Services       Precautions / Restrictions Precautions Precautions: Fall Restrictions Weight Bearing Restrictions: No      Mobility  Bed Mobility Overal bed mobility: Needs Assistance Bed Mobility: Supine to Sit     Supine to sit: Mod assist;Max assist     General bed mobility comments: slow labored movement  Transfers Overall transfer level: Needs  assistance Equipment used: 1 person hand held assist Transfers: Sit to/from Stand;Stand Pivot Transfers Sit to Stand: Mod assist Stand pivot transfers: Mod assist;Max assist       General transfer comment: able to fully extend knees when standing, good hand grip strength  Ambulation/Gait                Stairs            Wheelchair Mobility    Modified Rankin (Stroke Patients Only)       Balance Overall balance assessment: Needs assistance Sitting-balance support: Feet supported;No upper extremity supported Sitting balance-Leahy Scale: Fair     Standing balance support: Single extremity supported;During functional activity Standing balance-Leahy Scale: Poor Standing balance comment: during stand pivot transfers                             Pertinent Vitals/Pain Pain Assessment: No/denies pain    Home Living Family/patient expects to be discharged to:: Private residence Living Arrangements: Spouse/significant other   Type of Home: House Home Access: Ramped entrance     Home Layout: One level Home Equipment: Wheelchair - manual;Shower seat      Prior Function Level of Independence: Needs assistance   Gait / Transfers Assistance Needed: non-ambulatory, assisted transfers   ADL's / Homemaking Assistance Needed: assisted by her spouse        Hand Dominance        Extremity/Trunk Assessment   Upper Extremity Assessment Upper Extremity Assessment: Generalized weakness  Lower Extremity Assessment Lower Extremity Assessment: Generalized weakness    Cervical / Trunk Assessment Cervical / Trunk Assessment: Kyphotic  Communication   Communication: No difficulties  Cognition Arousal/Alertness: Awake/alert Behavior During Therapy: WFL for tasks assessed/performed Overall Cognitive Status: Within Functional Limits for tasks assessed                                        General Comments      Exercises      Assessment/Plan    PT Assessment All further PT needs can be met in the next venue of care  PT Problem List Decreased strength;Decreased activity tolerance;Decreased balance;Decreased mobility       PT Treatment Interventions      PT Goals (Current goals can be found in the Care Plan section)  Acute Rehab PT Goals Patient Stated Goal: return home PT Goal Formulation: With patient/family Time For Goal Achievement: 01/30/19 Potential to Achieve Goals: Good    Frequency     Barriers to discharge        Co-evaluation               AM-PAC PT "6 Clicks" Mobility  Outcome Measure Help needed turning from your back to your side while in a flat bed without using bedrails?: A Lot Help needed moving from lying on your back to sitting on the side of a flat bed without using bedrails?: A Lot Help needed moving to and from a bed to a chair (including a wheelchair)?: A Lot Help needed standing up from a chair using your arms (e.g., wheelchair or bedside chair)?: A Lot Help needed to walk in hospital room?: Total Help needed climbing 3-5 steps with a railing? : Total 6 Click Score: 10    End of Session Equipment Utilized During Treatment: Gait belt Activity Tolerance: Patient tolerated treatment well;Patient limited by fatigue Patient left: in chair;with call bell/phone within reach Nurse Communication: Mobility status PT Visit Diagnosis: Unsteadiness on feet (R26.81);Other abnormalities of gait and mobility (R26.89);Muscle weakness (generalized) (M62.81)    Time: 1014-1050 PT Time Calculation (min) (ACUTE ONLY): 36 min   Charges:   PT Evaluation $PT Eval Moderate Complexity: 1 Mod PT Treatments $Therapeutic Activity: 23-37 mins        12:35 PM, 01/30/19 Lonell Grandchild, MPT Physical Therapist with [facility] 336 [phone] office [phone] mobile phone

## 2019-01-30 NOTE — Discharge Summary (Signed)
Physician Discharge Summary  [name] LKG:[id number] DOB: 22-Jul-1923 DOA: 01/28/2019  PCP: [name], MD  Admit date: 01/28/2019  Discharge date: 01/30/2019  Admitted From:Home  Disposition:  Home  Recommendations for Outpatient Follow-up:  1. Follow up with PCP in 1-2 weeks 2. Patient to remain on aspirin 325 mg daily, indefinitely as well as Plavix 75 mg for the next 3 months and simvastatin 10 mg daily. 3. Patient given home health physical therapy. 4. Follow-up with neurology Dr. Merlene Laughter in 3 months.  Home Health:Yes with PT  Equipment/Devices: None  Discharge Condition: Stable  CODE STATUS: Full  Diet recommendation: Heart Healthy  Brief/Interim Summary: Per HPI: [name] a84 y.o.female,with history of hypertension, wheelchair-bound who was brought to the ED by family after she was found to stop responding for almost 10 minutes this evening. [place] trying to get out of the car patient stopped responding to any questions. She took off her sunglasses and then with just tearing, not responding to any questions. She was "staring off"and making chewing motions clicking her teeth".After about 10 minutes she started to respond and family drove her to the ED.Within 5 minutes after that she was communicating normally and said that she "just felt hot".Family did not notice any bowel or urinary incontinence. Denies any headache, blurred vision, dizziness, nausea, vomiting, cough, chest pain, shortness of breath, abdominal pain, dysuria or diarrhea. Denies any tingling numbness or weakness of her extremities. No recent fall or head trauma. No recent fevers, chills, recent illness, sick contact or recent travel. No changes in her medication (reportedly takes blood pressure medication with the family does not remember). Patient is wheelchair-bound at baseline and lives with her elderly husband who helps with her ADLs. At  baseline she is alert and oriented and able to carry out normal conversations.  Patient was admitted with transient global amnesia.  MRI revealed acute CVA in the watershed region of the left cerebral hemisphere.  Patient has had a EEG with no signs of seizure-like activity noted.  Fasting lipid profile did show LDL above 70 for which she has been started on low-dose simvastatin.  She will be maintained on full dose aspirin as well as Plavix as recommended by neurology.  The Plavix has been recommended for 3 months and aspirin indefinitely.  She is noted to have extensive carotid plaques noted bilaterally on ultrasound, but no hemodynamically significant findings.  2D echocardiogram with EF greater than 65% and some relaxation abnormalities, but no other acute concerns noted.  No other acute abnormalities noted during the course of this admission.  Patient was initially recommended SNF/rehab by PT, but would like to go home with home health physical therapy which will be set up on discharge.  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  Discharge Instructions  Discharge Instructions    Diet - low sodium heart healthy   Complete by:  As directed    Increase activity slowly   Complete by:  As directed      Allergies as of 01/30/2019      Reactions   Codeine Nausea And Vomiting      Medication List    STOP taking these medications   aspirin EC 81 MG tablet Replaced by:  aspirin 325 MG tablet     TAKE  these medications   aspirin 325 MG tablet Take 1 tablet (325 mg total) by mouth daily for 30 days. Start taking on:  January 31, 2019 Replaces:  aspirin EC 81 MG tablet   clopidogrel 75 MG tablet Commonly known as:  PLAVIX Take 1 tablet (75 mg total) by mouth daily with  breakfast for 30 days. Start taking on:  January 31, 2019   simvastatin 10 MG tablet Commonly known as:  ZOCOR Take 1 tablet (10 mg total) by mouth daily at 6 PM for 30 days.      Follow-up Information    [name], MD Follow up in 1 week(s).   Specialty:  Internal Medicine Contact information: [address] [place] [address] [phone]        Phillips Odor, MD Follow up in 3 month(s).   Specialty:  Neurology Contact information: [address] [place] [address] [phone]          Allergies  Allergen Reactions  . Codeine Nausea And Vomiting    Consultations:  Neurology   Procedures/Studies: Dg Chest 2 View  Result Date: 01/28/2019 CLINICAL DATA:  Altered mental status EXAM: CHEST - 2 VIEW COMPARISON:  04/26/2015 FINDINGS: Increased prominence of the right hilum compared to 04/26/2015. There is bibasilar atelectasis without focal consolidation. No pleural effusion or pneumothorax. Diffuse soft tissue nodularity is unchanged. IMPRESSION: 1. Increased right hilar prominence relative to the prior chest radiograph. This may be due to differences in position/rotation, but chest CT should be considered for better characterization. 2. No acute airspace disease. Electronically Signed   By: Ulyses Jarred M.D.   On: 01/28/2019 20:29   Ct Head Wo Contrast  Result Date: 01/28/2019 CLINICAL DATA:  Altered mental status. Possible aphasia. EXAM: CT HEAD WITHOUT CONTRAST TECHNIQUE: Contiguous axial images were obtained from the base of the skull through the vertex without intravenous contrast. COMPARISON:  03/11/2014 FINDINGS: Brain: Diffuse cerebral atrophy. Ventricular dilatation consistent with central atrophy. Low-attenuation changes in the deep white matter consistent with small vessel ischemia. No mass-effect or midline shift. No abnormal extra-axial fluid collections. Gray-white matter junctions are distinct. Basal cisterns are not effaced. No  acute intracranial hemorrhage. Vascular: Moderate intracranial arterial vascular calcifications. Skull: Calvarium appears intact. Sinuses/Orbits: Paranasal sinuses and mastoid air cells are clear. Other: None. IMPRESSION: No acute intracranial abnormalities. Chronic atrophy and small vessel ischemic changes. Electronically Signed   By: Lucienne Capers M.D.   On: 01/28/2019 20:06   Mr Jodene Nam Head Wo Contrast  Result Date: 01/29/2019 CLINICAL DATA:  Altered mental status.  Unresponsive. EXAM: MRI HEAD WITHOUT CONTRAST MRA HEAD WITHOUT CONTRAST TECHNIQUE: Multiplanar, multiecho pulse sequences of the brain and surrounding structures were obtained without intravenous contrast. Angiographic images of the head were obtained using MRA technique without contrast. COMPARISON:  Head CT same day.  MRI 05/09/2006. FINDINGS: MRI HEAD FINDINGS Brain: Diffusion imaging shows a number small watershed distribution infarctions within the white matter of the left cerebral hemisphere from the frontal parietal region. No evidence of hemorrhage, swelling or mass effect. Brainstem and cerebellum are unremarkable. Cerebral hemispheres elsewhere show chronic small-vessel ischemic changes throughout the deep and subcortical white matter. No mass lesion hydrocephalus or extra-axial collection. Vascular: Major vessels at the base of the brain show flow. Skull and upper cervical spine: Negative Sinuses/Orbits: Clear/normal Other: None MRA HEAD FINDINGS Both internal carotid arteries are patent through the skull base and siphon regions. The anterior and middle cerebral vessels are patent. Motion degradation does not  allow accurate evaluation of the more distal branch vessels. There is potential stenosis or occlusion of the left anterior cerebral artery. There appear to be branch vessel stenoses within the middle cerebral artery branches. Both vertebral arteries are patent to the basilar. No flow limiting basilar stenosis. Posterior circulation  branch vessels show flow. Left PCA takes fetal origin the anterior circulation. IMPRESSION: Acute watershed infarctions within the deep white matter of the left cerebral hemisphere. No swelling or hemorrhage. Intracranial MR angiography suffers from motion degradation. There is potential for severe stenosis or occlusion of the left anterior cerebral artery. There also may be multiple stenoses more distal MCA branch vessels, but confidence is not high given motion degradation. Electronically Signed   By: Nelson Chimes M.D.   On: 01/29/2019 13:10   Mr Brain Wo Contrast  Result Date: 01/29/2019 CLINICAL DATA:  Altered mental status.  Unresponsive. EXAM: MRI HEAD WITHOUT CONTRAST MRA HEAD WITHOUT CONTRAST TECHNIQUE: Multiplanar, multiecho pulse sequences of the brain and surrounding structures were obtained without intravenous contrast. Angiographic images of the head were obtained using MRA technique without contrast. COMPARISON:  Head CT same day.  MRI 05/09/2006. FINDINGS: MRI HEAD FINDINGS Brain: Diffusion imaging shows a number small watershed distribution infarctions within the white matter of the left cerebral hemisphere from the frontal parietal region. No evidence of hemorrhage, swelling or mass effect. Brainstem and cerebellum are unremarkable. Cerebral hemispheres elsewhere show chronic small-vessel ischemic changes throughout the deep and subcortical white matter. No mass lesion hydrocephalus or extra-axial collection. Vascular: Major vessels at the base of the brain show flow. Skull and upper cervical spine: Negative Sinuses/Orbits: Clear/normal Other: None MRA HEAD FINDINGS Both internal carotid arteries are patent through the skull base and siphon regions. The anterior and middle cerebral vessels are patent. Motion degradation does not allow accurate evaluation of the more distal branch vessels. There is potential stenosis or occlusion of the left anterior cerebral artery. There appear to be branch  vessel stenoses within the middle cerebral artery branches. Both vertebral arteries are patent to the basilar. No flow limiting basilar stenosis. Posterior circulation branch vessels show flow. Left PCA takes fetal origin the anterior circulation. IMPRESSION: Acute watershed infarctions within the deep white matter of the left cerebral hemisphere. No swelling or hemorrhage. Intracranial MR angiography suffers from motion degradation. There is potential for severe stenosis or occlusion of the left anterior cerebral artery. There also may be multiple stenoses more distal MCA branch vessels, but confidence is not high given motion degradation. Electronically Signed   By: Nelson Chimes M.D.   On: 01/29/2019 13:10   US Carotid Bilateral  Result Date: 01/29/2019 CLINICAL DATA:  Altered mental status. Possible left-sided CVA. History of hypertension. EXAM: BILATERAL CAROTID DUPLEX ULTRASOUND TECHNIQUE: Pearline Cables scale imaging, color Doppler and duplex ultrasound were performed of bilateral carotid and vertebral arteries in the neck. COMPARISON:  01/31/2010 FINDINGS: Criteria: Quantification of carotid stenosis is based on velocity parameters that correlate the residual internal carotid diameter with NASCET-based stenosis levels, using the diameter of the distal internal carotid lumen as the denominator for stenosis measurement. The following velocity measurements were obtained: RIGHT ICA: 76/11 cm/sec CCA: 68/34 cm/sec SYSTOLIC ICA/CCA RATIO:  0.5 ECA: 115 cm/sec LEFT ICA: 93/17 cm/sec CCA: 19/62 cm/sec SYSTOLIC ICA/CCA RATIO:  0.8 ECA: 134 cm/sec RIGHT CAROTID ARTERY: There is a large amount of eccentric echogenic plaque within the right carotid bulb (images 14 and 16) with minimal to moderate amount of eccentric echogenic plaque involving the origin and  proximal aspects of the right internal carotid artery (image 24), likely progressed compared to remote examination performed 01/2010 though again not resulting in elevated  peak systolic velocities within the interrogated course of the right internal carotid artery to suggest a hemodynamically significant stenosis. RIGHT VERTEBRAL ARTERY:  Antegrade flow LEFT CAROTID ARTERY: There is a large amount of eccentric densely shadowing atherosclerotic plaque within the left carotid bulb (images 48 and 50), extending to involve the origin and proximal aspect of the left internal carotid artery (image 58), likely progressed compared to the 01/2010 examination, though again not resulting in elevated peak systolic velocities within the interrogated course of the left internal carotid artery to suggest a hemodynamically significant stenosis. LEFT VERTEBRAL ARTERY:  Antegrade flow IMPRESSION: Large amount of bilateral atherosclerotic plaque, left greater than right, likely progressed compared to remote examination performed 01/2010, though again not resulting in elevated peak systolic velocities within either internal carotid artery to suggest a hemodynamically significant stenosis. Electronically Signed   By: Sandi Mariscal M.D.   On: 01/29/2019 16:40     Discharge Exam: Vitals:   01/30/19 1000 01/30/19 1133  BP: (!) 162/89   Pulse: (!) 106   Resp: (!) 30   Temp:  (!) 97.3 F (36.3 C)  SpO2: 94%    Vitals:   01/30/19 0800 01/30/19 0900 01/30/19 1000 01/30/19 1133  BP: (!) 177/91  (!) 162/89   Pulse: (!) 109 (!) 109 (!) 106   Resp: 19 (!) 25 (!) 30   Temp: 97.6 F (36.4 C)   (!) 97.3 F (36.3 C)  TempSrc: Oral   Oral  SpO2: 98% 99% 94%   Weight:      Height:        General: Pt is alert, awake, not in acute distress Cardiovascular: RRR, S1/S2 +, no rubs, no gallops Respiratory: CTA bilaterally, no wheezing, no rhonchi Abdominal: Soft, NT, ND, bowel sounds + Extremities: no edema, no cyanosis    The results of significant diagnostics from this hospitalization (including imaging, microbiology, ancillary and laboratory) are listed below for reference.      Microbiology: No results found for this or any previous visit (from the past 240 hour(s)).   Labs: BNP (last 3 results) No results for input(s): BNP in the last 8760 hours. Basic Metabolic Panel: Recent Labs  Lab 01/28/19 1833 01/30/19 0441  NA 130* 132*  K 4.0 3.9  CL 99 106  CO2 22 17*  GLUCOSE 117* 119*  BUN 19 11  CREATININE 0.63 0.42*  CALCIUM 8.3* 7.4*   Liver Function Tests: Recent Labs  Lab 01/28/19 1833  AST 27  ALT 20  ALKPHOS 75  BILITOT 0.7  PROT 7.4  ALBUMIN 4.4   No results for input(s): LIPASE, AMYLASE in the last 168 hours. No results for input(s): AMMONIA in the last 168 hours. CBC: Recent Labs  Lab 01/28/19 1833 01/30/19 0441  WBC 14.7* 12.3*  NEUTROABS 10.9*  --   HGB 13.9 13.4  HCT 44.1 40.5  MCV 95.7 91.6  PLT 371 326   Cardiac Enzymes: Recent Labs  Lab 01/28/19 1833  TROPONINI <0.03   BNP: Invalid input(s): POCBNP CBG: Recent Labs  Lab 01/28/19 1829  GLUCAP 111*   D-Dimer No results for input(s): DDIMER in the last 72 hours. Hgb A1c Recent Labs    01/29/19 1512  HGBA1C 5.1   Lipid Profile Recent Labs    01/29/19 1512  CHOL 147  HDL 51  LDLCALC 84  TRIG 62  CHOLHDL 2.9   Thyroid function studies Recent Labs    01/28/19 1833  TSH 4.144   Anemia work up No results for input(s): VITAMINB12, FOLATE, FERRITIN, TIBC, IRON, RETICCTPCT in the last 72 hours. Urinalysis    Component Value Date/Time   COLORURINE YELLOW 01/28/2019 1924   APPEARANCEUR HAZY (A) 01/28/2019 1924   LABSPEC 1.016 01/28/2019 1924   PHURINE 5.0 01/28/2019 [place] NEGATIVE 01/28/2019 [place] NEGATIVE 01/28/2019 1924   BILIRUBINUR NEGATIVE 01/28/2019 1924   KETONESUR 5 (A) 01/28/2019 1924   PROTEINUR 30 (A) 01/28/2019 1924   UROBILINOGEN 0.2 09/30/2009 1909   NITRITE NEGATIVE 01/28/2019 1924   LEUKOCYTESUR NEGATIVE 01/28/2019 1924   Sepsis Labs Invalid input(s): PROCALCITONIN,  WBC,  LACTICIDVEN Microbiology No  results found for this or any previous visit (from the past 240 hour(s)).   Time coordinating discharge: 35 minutes  SIGNED:   Rodena Goldmann, DO [facility] 01/30/2019, 11:48 AM  If 7PM-7AM, please contact night-coverage [email or web address] Password [email or web address]

## 2019-01-30 NOTE — TOC Transition Note (Signed)
Transition of Care [facility]) - CM/SW Discharge Note   Patient Details  Name: Tiffany Shannon MRN: [id number] Date of Birth: 1923/09/02  [facility] CM/SW Contact:  Shade Flood, LCSW Phone Number: 01/30/2019, 11:49 AM   Clinical Narrative:  Pt will dc home today per MD. [facility] will follow pt at home for home PT.    Final next level of care: [place] Barriers to Discharge: No Barriers Identified   Patient Goals and CMS Choice Patient states their goals for this hospitalization and ongoing recovery are:: home with family support CMS Medicare.gov Compare Post Acute Care list provided to:: Patient Represenative (must comment)(husband) Choice offered to / list presented to : Spouse  Discharge Placement                       Discharge Plan and Services   Post Acute Care Choice: Home Health              HH Arranged: PT [facility] Agency: [place] ([facility])   Social Determinants of Health (SDOH) Interventions     Readmission Risk Interventions No flowsheet data found.

## 2019-02-02 DIAGNOSIS — I69398 Other sequelae of cerebral infarction: Secondary | ICD-10-CM | POA: Diagnosis not present

## 2019-02-02 DIAGNOSIS — G9341 Metabolic encephalopathy: Secondary | ICD-10-CM | POA: Diagnosis not present

## 2019-02-02 DIAGNOSIS — I10 Essential (primary) hypertension: Secondary | ICD-10-CM | POA: Diagnosis not present

## 2019-02-03 DIAGNOSIS — F458 Other somatoform disorders: Secondary | ICD-10-CM | POA: Diagnosis not present

## 2019-02-03 DIAGNOSIS — I639 Cerebral infarction, unspecified: Secondary | ICD-10-CM | POA: Diagnosis not present

## 2019-02-03 DIAGNOSIS — I10 Essential (primary) hypertension: Secondary | ICD-10-CM | POA: Diagnosis not present

## 2019-02-04 DIAGNOSIS — I69398 Other sequelae of cerebral infarction: Secondary | ICD-10-CM | POA: Diagnosis not present

## 2019-02-04 DIAGNOSIS — G9341 Metabolic encephalopathy: Secondary | ICD-10-CM | POA: Diagnosis not present

## 2019-02-04 DIAGNOSIS — I10 Essential (primary) hypertension: Secondary | ICD-10-CM | POA: Diagnosis not present

## 2019-02-10 DIAGNOSIS — I10 Essential (primary) hypertension: Secondary | ICD-10-CM | POA: Diagnosis not present

## 2019-02-10 DIAGNOSIS — I69398 Other sequelae of cerebral infarction: Secondary | ICD-10-CM | POA: Diagnosis not present

## 2019-02-10 DIAGNOSIS — G9341 Metabolic encephalopathy: Secondary | ICD-10-CM | POA: Diagnosis not present

## 2019-02-12 DIAGNOSIS — I69398 Other sequelae of cerebral infarction: Secondary | ICD-10-CM | POA: Diagnosis not present

## 2019-02-12 DIAGNOSIS — G9341 Metabolic encephalopathy: Secondary | ICD-10-CM | POA: Diagnosis not present

## 2019-02-12 DIAGNOSIS — I10 Essential (primary) hypertension: Secondary | ICD-10-CM | POA: Diagnosis not present

## 2019-02-16 DIAGNOSIS — G9341 Metabolic encephalopathy: Secondary | ICD-10-CM | POA: Diagnosis not present

## 2019-02-16 DIAGNOSIS — I69398 Other sequelae of cerebral infarction: Secondary | ICD-10-CM | POA: Diagnosis not present

## 2019-02-16 DIAGNOSIS — I10 Essential (primary) hypertension: Secondary | ICD-10-CM | POA: Diagnosis not present

## 2019-02-19 DIAGNOSIS — I10 Essential (primary) hypertension: Secondary | ICD-10-CM | POA: Diagnosis not present

## 2019-02-19 DIAGNOSIS — I69398 Other sequelae of cerebral infarction: Secondary | ICD-10-CM | POA: Diagnosis not present

## 2019-02-19 DIAGNOSIS — G9341 Metabolic encephalopathy: Secondary | ICD-10-CM | POA: Diagnosis not present

## 2019-03-16 DIAGNOSIS — I739 Peripheral vascular disease, unspecified: Secondary | ICD-10-CM | POA: Diagnosis not present

## 2019-05-21 DIAGNOSIS — E785 Hyperlipidemia, unspecified: Secondary | ICD-10-CM | POA: Diagnosis not present

## 2019-05-21 DIAGNOSIS — E039 Hypothyroidism, unspecified: Secondary | ICD-10-CM | POA: Diagnosis not present

## 2019-05-21 DIAGNOSIS — I10 Essential (primary) hypertension: Secondary | ICD-10-CM | POA: Diagnosis not present

## 2019-05-21 DIAGNOSIS — Z0000 Encounter for general adult medical examination without abnormal findings: Secondary | ICD-10-CM | POA: Diagnosis not present

## 2019-05-21 DIAGNOSIS — E559 Vitamin D deficiency, unspecified: Secondary | ICD-10-CM | POA: Diagnosis not present

## 2019-05-25 ENCOUNTER — Encounter (INDEPENDENT_AMBULATORY_CARE_PROVIDER_SITE_OTHER): Payer: Medicare Other | Admitting: Ophthalmology

## 2019-05-25 ENCOUNTER — Other Ambulatory Visit: Payer: Self-pay

## 2019-05-25 DIAGNOSIS — H353132 Nonexudative age-related macular degeneration, bilateral, intermediate dry stage: Secondary | ICD-10-CM | POA: Diagnosis not present

## 2019-05-25 DIAGNOSIS — H43813 Vitreous degeneration, bilateral: Secondary | ICD-10-CM | POA: Diagnosis not present

## 2019-05-29 DIAGNOSIS — E559 Vitamin D deficiency, unspecified: Secondary | ICD-10-CM | POA: Diagnosis not present

## 2019-05-29 DIAGNOSIS — N3281 Overactive bladder: Secondary | ICD-10-CM | POA: Diagnosis not present

## 2019-05-29 DIAGNOSIS — R32 Unspecified urinary incontinence: Secondary | ICD-10-CM | POA: Diagnosis not present

## 2019-05-29 DIAGNOSIS — N390 Urinary tract infection, site not specified: Secondary | ICD-10-CM | POA: Diagnosis not present

## 2019-05-29 DIAGNOSIS — Z8673 Personal history of transient ischemic attack (TIA), and cerebral infarction without residual deficits: Secondary | ICD-10-CM | POA: Diagnosis not present

## 2019-05-31 IMAGING — US BILATERAL CAROTID DUPLEX ULTRASOUND
1 series · 13 of 24 positions shown · non-contrast
Comparison: 01/31/2010

CLINICAL DATA: Altered mental status. Possible left-sided CVA.
History of hypertension.

EXAM:
BILATERAL CAROTID DUPLEX ULTRASOUND
TECHNIQUE: Gray scale imaging, color Doppler and duplex ultrasound were
performed of bilateral carotid and vertebral arteries in the neck.

[Series 1: bilateral carotid duplex ultrasound · 13 of 88 slices shown]
[im 1/88]
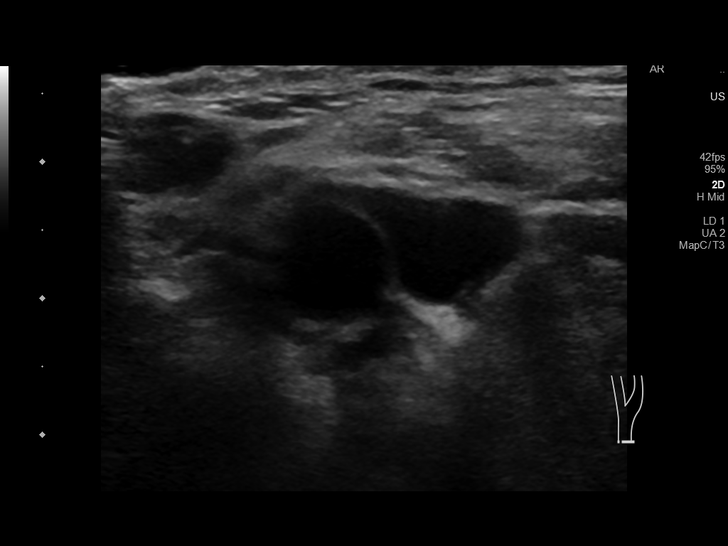
[im 8/88]
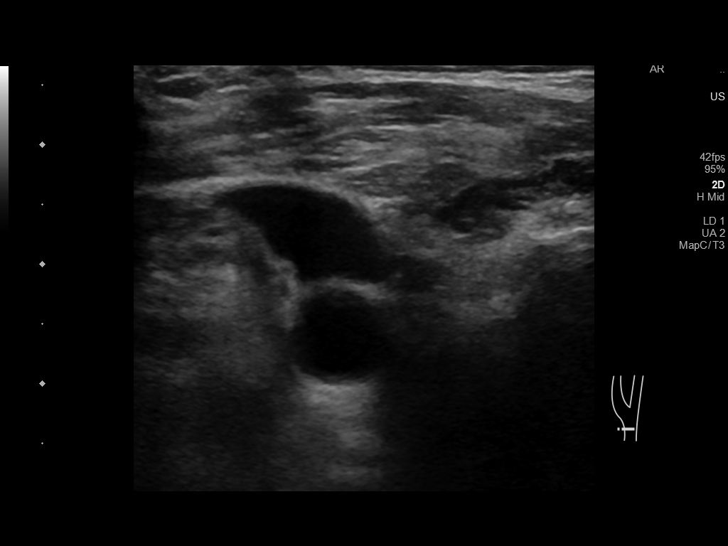
[im 16/88]
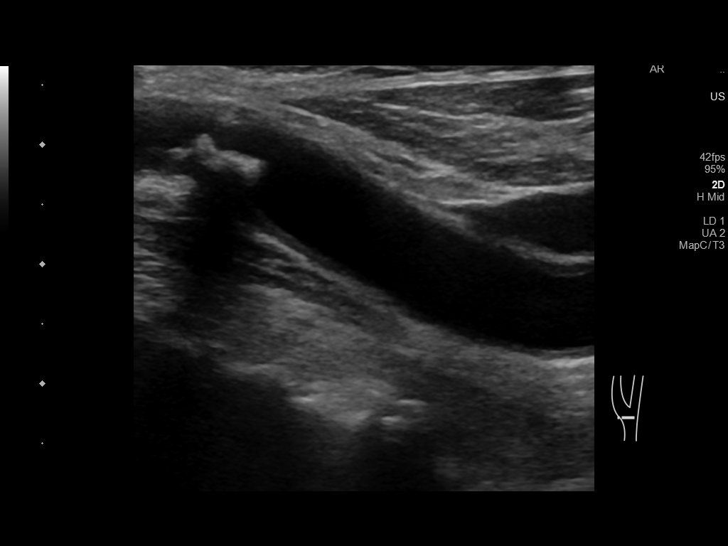
[im 23/88]
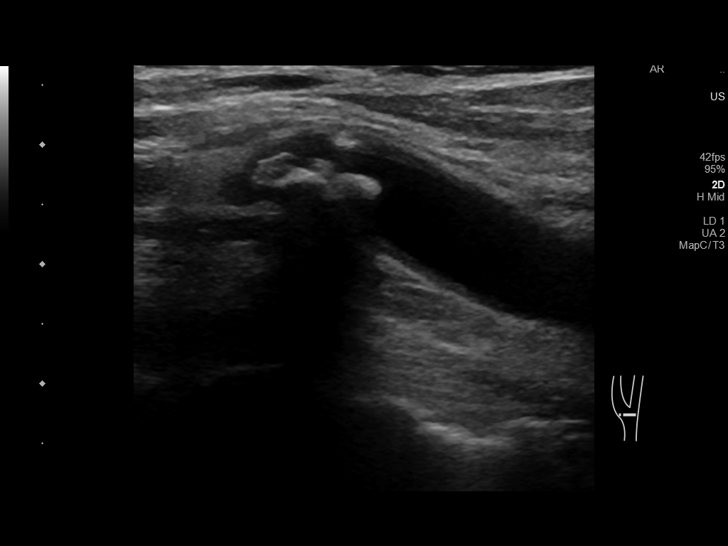
[im 31/88]
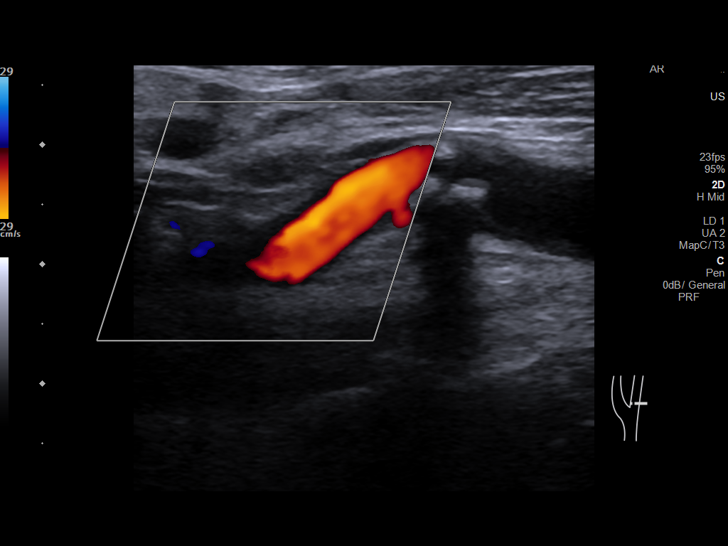
[im 38/88]
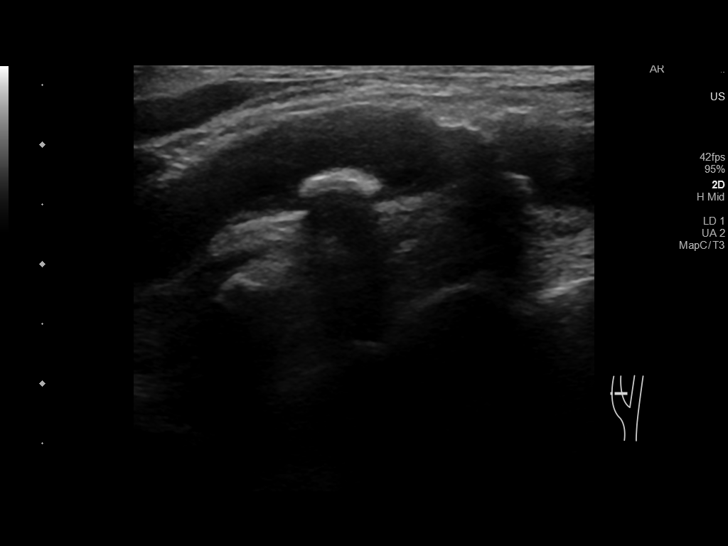
[im 46/88]
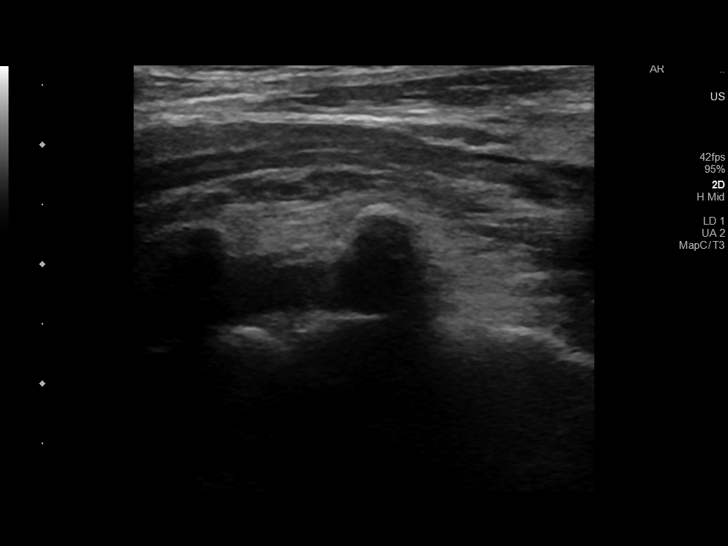
[im 50/88]
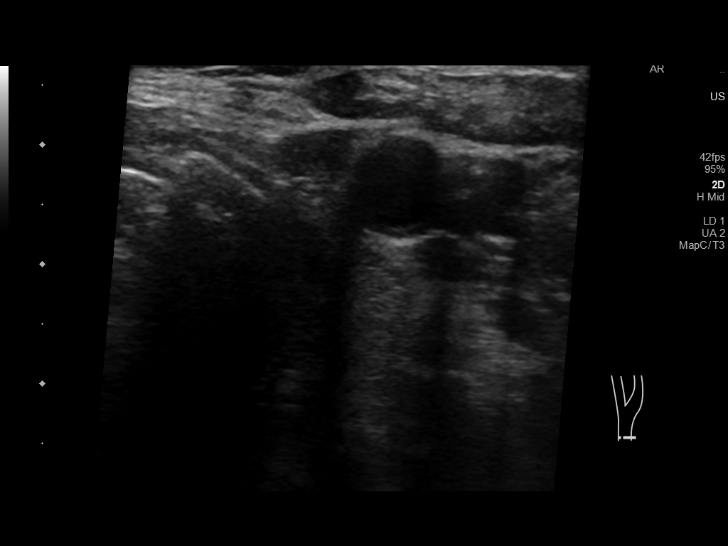
[im 57/88]
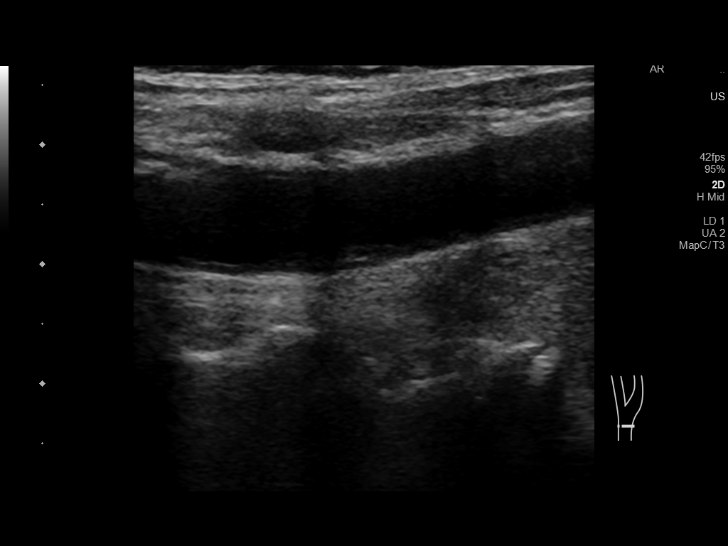
[im 65/88]
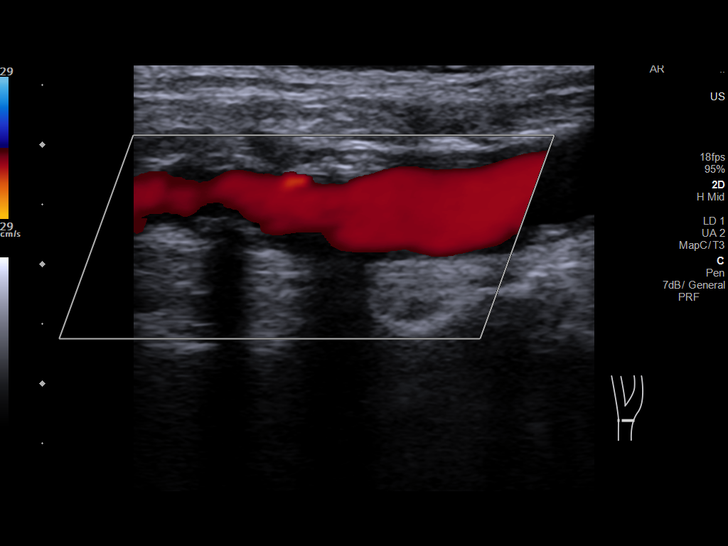
[im 72/88]
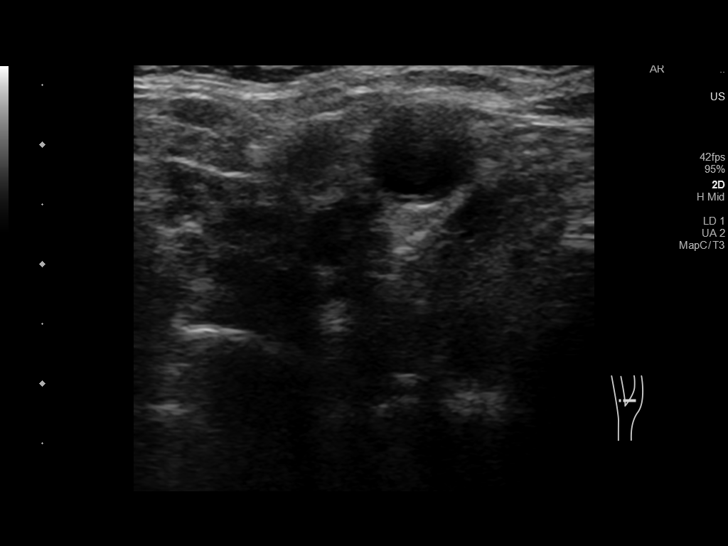
[im 80/88]
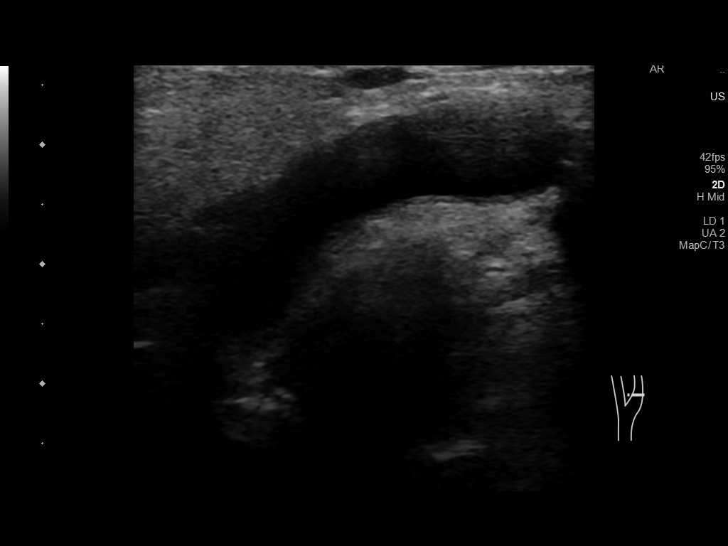
[im 88/88]
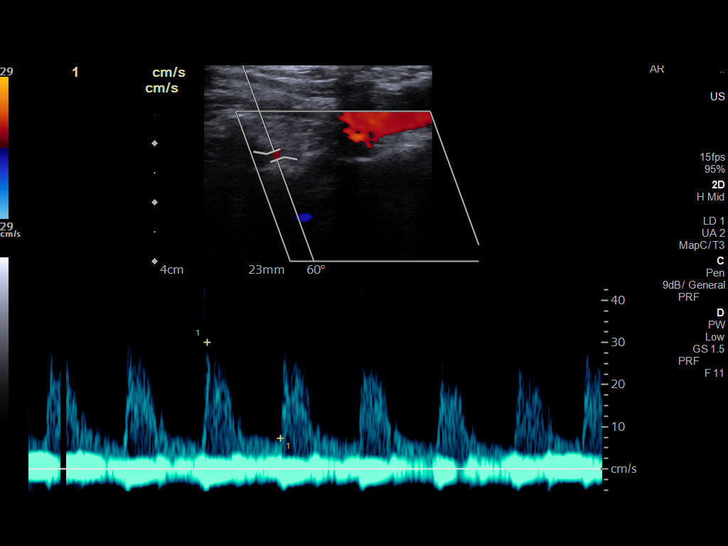

[13 of 24 positions shown; findings below may reference images not displayed]

FINDINGS: Criteria: Quantification of carotid stenosis is based on velocity
parameters that correlate the residual internal carotid diameter
with NASCET-based stenosis levels, using the diameter of the distal
internal carotid lumen as the denominator for stenosis measurement.

The following velocity measurements were obtained:

RIGHT

ICA: 76/11 cm/sec

CCA: 81/30 cm/sec

SYSTOLIC ICA/CCA RATIO:

ECA: 115 cm/sec

LEFT

ICA: 93/17 cm/sec

CCA: 72/12 cm/sec

SYSTOLIC ICA/CCA RATIO:

ECA: 134 cm/sec

RIGHT CAROTID ARTERY: There is a large amount of eccentric echogenic
plaque within the right carotid bulb (images 14 and 16) with minimal
to moderate amount of eccentric echogenic plaque involving the
origin and proximal aspects of the right internal carotid artery
(image 24), likely progressed compared to remote examination
systolic velocities within the interrogated course of the right
internal carotid artery to suggest a hemodynamically significant
stenosis.

RIGHT VERTEBRAL ARTERY:  Antegrade flow

LEFT CAROTID ARTERY: There is a large amount of eccentric densely
shadowing atherosclerotic plaque within the left carotid bulb
(images 48 and 50), extending to involve the origin and proximal
aspect of the left internal carotid artery (image 58), likely
progressed compared to the [DATE] examination, though again not
resulting in elevated peak systolic velocities within the
interrogated course of the left internal carotid artery to suggest a
hemodynamically significant stenosis.

LEFT VERTEBRAL ARTERY:  Antegrade flow
IMPRESSION: Large amount of bilateral atherosclerotic plaque, left greater than
right, likely progressed compared to remote examination performed
velocities within either internal carotid artery to suggest a
hemodynamically significant stenosis.

## 2019-06-16 DIAGNOSIS — I739 Peripheral vascular disease, unspecified: Secondary | ICD-10-CM | POA: Diagnosis not present

## 2019-07-02 DIAGNOSIS — M810 Age-related osteoporosis without current pathological fracture: Secondary | ICD-10-CM | POA: Diagnosis not present

## 2019-08-21 DIAGNOSIS — E559 Vitamin D deficiency, unspecified: Secondary | ICD-10-CM | POA: Diagnosis not present

## 2019-08-21 DIAGNOSIS — I10 Essential (primary) hypertension: Secondary | ICD-10-CM | POA: Diagnosis not present

## 2019-08-21 DIAGNOSIS — Z8673 Personal history of transient ischemic attack (TIA), and cerebral infarction without residual deficits: Secondary | ICD-10-CM | POA: Diagnosis not present

## 2019-08-28 DIAGNOSIS — Z8673 Personal history of transient ischemic attack (TIA), and cerebral infarction without residual deficits: Secondary | ICD-10-CM | POA: Diagnosis not present

## 2019-08-28 DIAGNOSIS — R32 Unspecified urinary incontinence: Secondary | ICD-10-CM | POA: Diagnosis not present

## 2019-08-28 DIAGNOSIS — Z780 Asymptomatic menopausal state: Secondary | ICD-10-CM | POA: Diagnosis not present

## 2019-08-28 DIAGNOSIS — Z23 Encounter for immunization: Secondary | ICD-10-CM | POA: Diagnosis not present

## 2019-08-28 DIAGNOSIS — E559 Vitamin D deficiency, unspecified: Secondary | ICD-10-CM | POA: Diagnosis not present

## 2019-08-28 DIAGNOSIS — E039 Hypothyroidism, unspecified: Secondary | ICD-10-CM | POA: Diagnosis not present

## 2019-08-28 DIAGNOSIS — I10 Essential (primary) hypertension: Secondary | ICD-10-CM | POA: Diagnosis not present

## 2019-09-01 DIAGNOSIS — I739 Peripheral vascular disease, unspecified: Secondary | ICD-10-CM | POA: Diagnosis not present

## 2019-09-07 ENCOUNTER — Ambulatory Visit
Admission: EM | Admit: 2019-09-07 | Discharge: 2019-09-07 | Disposition: A | Discharge status: Home / Self Care | Payer: Medicare Other

## 2019-09-07 ENCOUNTER — Other Ambulatory Visit: Payer: Self-pay

## 2019-09-08 ENCOUNTER — Ambulatory Visit (HOSPITAL_COMMUNITY)
Admission: RE | Admit: 2019-09-08 | Discharge: 2019-09-08 | Disposition: A | Discharge status: Home / Self Care | Payer: Medicare Other | Source: Ambulatory Visit | Attending: Family | Admitting: Family

## 2019-09-08 ENCOUNTER — Other Ambulatory Visit (HOSPITAL_COMMUNITY): Payer: Self-pay | Admitting: Internal Medicine

## 2019-09-08 DIAGNOSIS — I10 Essential (primary) hypertension: Secondary | ICD-10-CM | POA: Diagnosis not present

## 2019-09-08 DIAGNOSIS — M79604 Pain in right leg: Secondary | ICD-10-CM

## 2019-09-08 DIAGNOSIS — M79606 Pain in leg, unspecified: Secondary | ICD-10-CM | POA: Diagnosis not present

## 2019-09-08 DIAGNOSIS — M79661 Pain in right lower leg: Secondary | ICD-10-CM | POA: Diagnosis not present

## 2019-09-08 DIAGNOSIS — M7989 Other specified soft tissue disorders: Secondary | ICD-10-CM | POA: Diagnosis not present

## 2019-09-08 DIAGNOSIS — M79605 Pain in left leg: Secondary | ICD-10-CM

## 2019-09-08 DIAGNOSIS — M79662 Pain in left lower leg: Secondary | ICD-10-CM | POA: Diagnosis not present

## 2019-10-21 ENCOUNTER — Emergency Department (HOSPITAL_COMMUNITY): Payer: Medicare Other

## 2019-10-21 ENCOUNTER — Encounter (HOSPITAL_COMMUNITY): Payer: Self-pay | Admitting: *Deleted

## 2019-10-21 ENCOUNTER — Other Ambulatory Visit: Payer: Self-pay

## 2019-10-21 ENCOUNTER — Emergency Department (HOSPITAL_COMMUNITY)
Admission: EM | Admit: 2019-10-21 | Discharge: 2019-10-21 | Disposition: A | Discharge status: Home / Self Care | Payer: Medicare Other | Source: Home / Self Care | Attending: Emergency Medicine | Admitting: Emergency Medicine

## 2019-10-21 DIAGNOSIS — U071 COVID-19: Secondary | ICD-10-CM | POA: Insufficient documentation

## 2019-10-21 DIAGNOSIS — R531 Weakness: Secondary | ICD-10-CM | POA: Insufficient documentation

## 2019-10-21 DIAGNOSIS — E871 Hypo-osmolality and hyponatremia: Secondary | ICD-10-CM | POA: Insufficient documentation

## 2019-10-21 DIAGNOSIS — I10 Essential (primary) hypertension: Secondary | ICD-10-CM | POA: Insufficient documentation

## 2019-10-21 DIAGNOSIS — F0390 Unspecified dementia without behavioral disturbance: Secondary | ICD-10-CM

## 2019-10-21 DIAGNOSIS — Z20822 Contact with and (suspected) exposure to covid-19: Secondary | ICD-10-CM

## 2019-10-21 DIAGNOSIS — Z79899 Other long term (current) drug therapy: Secondary | ICD-10-CM | POA: Insufficient documentation

## 2019-10-21 DIAGNOSIS — Z20828 Contact with and (suspected) exposure to other viral communicable diseases: Secondary | ICD-10-CM

## 2019-10-21 DIAGNOSIS — R918 Other nonspecific abnormal finding of lung field: Secondary | ICD-10-CM | POA: Diagnosis not present

## 2019-10-21 LAB — BASIC METABOLIC PANEL
Anion gap: 11 (ref 5–15)
BUN: 19 mg/dL (ref 8–23)
CO2: 22 mmol/L (ref 22–32)
Calcium: 8.6 mg/dL — ABNORMAL LOW (ref 8.9–10.3)
Chloride: 105 mmol/L (ref 98–111)
Creatinine, Ser: 0.49 mg/dL (ref 0.44–1.00)
GFR calc Af Amer: >60 mL/min (ref >60)
GFR calc non Af Amer: >60 mL/min (ref >60)
Glucose, Bld: 97 mg/dL (ref 70–99)
Potassium: 4.5 mmol/L (ref 3.5–5.1)
Sodium: 138 mmol/L (ref 135–145)

## 2019-10-21 LAB — CBC WITH DIFFERENTIAL/PLATELET
Abs Immature Granulocytes: 0.02 10*3/uL (ref 0.00–0.07)
Basophils Absolute: 0 10*3/uL (ref 0.0–0.1)
Basophils Relative: 0 %
Eosinophils Absolute: 0 10*3/uL (ref 0.0–0.5)
Eosinophils Relative: 0 %
HCT: 41.7 % (ref 36.0–46.0)
Hemoglobin: 13 g/dL (ref 12.0–15.0)
Immature Granulocytes: 0 %
Lymphocytes Relative: 18 %
Lymphs Abs: 1.5 10*3/uL (ref 0.7–4.0)
MCH: 29.8 pg (ref 26.0–34.0)
MCHC: 31.2 g/dL (ref 30.0–36.0)
MCV: 95.6 fL (ref 80.0–100.0)
Monocytes Absolute: 0.6 10*3/uL (ref 0.1–1.0)
Monocytes Relative: 7 %
Neutro Abs: 6.1 10*3/uL (ref 1.7–7.7)
Neutrophils Relative %: 75 %
Platelets: 361 10*3/uL (ref 150–400)
RBC: 4.36 MIL/uL (ref 3.87–5.11)
RDW: 14.1 % (ref 11.5–15.5)
WBC: 8.1 10*3/uL (ref 4.0–10.5)
nRBC: 0 % (ref 0.0–0.2)

## 2019-10-21 LAB — TROPONIN I (HIGH SENSITIVITY)
Troponin I (High Sensitivity): 6 ng/L (ref <18)
Troponin I (High Sensitivity): 5 ng/L (ref <18)

## 2019-10-21 MED ORDER — ACETAMINOPHEN 325 MG PO TABS
650.0000 mg | ORAL_TABLET | Freq: Once | ORAL | Status: AC
  Administered 2019-10-21: 650 mg via ORAL
  Filled 2019-10-21: qty 2

## 2019-10-21 NOTE — ED Provider Notes (Signed)
[facility] EMERGENCY DEPARTMENT Provider Note   CSN: [id number] Arrival date & time: 10/21/19  1054     History   Chief Complaint Chief Complaint  Patient presents with  . Weakness    HPI Tiffany Shannon is a 85 y.o. female.     Patient came in with her husband.  Husband is her main caretaker.  She has dementia and is fairly bedridden.  Patient's husband has symptomatic for COVID-19 infection.  Which was confirmed.  Husband will require admission.  Patient without any symptoms.  They do have family locally that could take care of her if she is able to be discharged home.  Past medical history significant for hypotension hyponatremia in the past transient global amnesia.  Also acute metabolic encephalopathy in the past as well.  Will check basic labs.  Will get chest x-ray.  Patient's oxygen saturations are in the upper 90s is not tachypneic there is no fever.  Apparently no cough no diarrhea no fevers.     Past Medical History:  Diagnosis Date  . Hypertension     Patient Active Problem List   Diagnosis Date Noted  . Acute encephalopathy 01/30/2019  . Acute metabolic encephalopathy [id number]  . Transient global amnesia 01/28/2019  . Essential hypertension 01/28/2019  . Hyponatremia 01/28/2019    Past Surgical History:  Procedure Laterality Date  . ABDOMINAL HYSTERECTOMY    . APPENDECTOMY    . TONSILLECTOMY       OB History   No obstetric history on file.      Home Medications    Prior to Admission medications   Medication Sig Start Date End Date Taking? Authorizing Provider  clopidogrel (PLAVIX) 75 MG tablet Take 75 mg by mouth every morning. 07/29/19   [provider]  pantoprazole (PROTONIX) 40 MG tablet Take 40 mg by mouth 2 (two) times daily. 07/29/19   [provider]  simvastatin (ZOCOR) 10 MG tablet Take 1 tablet (10 mg total) by mouth daily at 6 PM for 30 days. 01/30/19 03/01/19  Manuella Ghazi, Pratik D, DO  solifenacin (VESICARE) 5 MG tablet  Take 5 mg by mouth daily. 07/29/19   [provider]    Family History No family history on file.  Social History Social History   Tobacco Use  . Smoking status: Never Smoker  . Smokeless tobacco: Never Used  Substance Use Topics  . Alcohol use: Never    Frequency: Never  . Drug use: Never     Allergies   Codeine   Review of Systems Review of Systems  Unable to perform ROS: Dementia     Physical Exam Updated Vital Signs BP (!) 155/71   Pulse 84   Temp 97.6 F (36.4 C)   Resp 16   Ht 1.524 m (5')   Wt 63.5 kg   SpO2 99%   BMI 27.34 kg/m   Physical Exam Vitals signs and nursing note reviewed.  Constitutional:      General: She is not in acute distress.    Appearance: Normal appearance. She is well-developed.  HENT:     Head: Normocephalic and atraumatic.  Eyes:     Extraocular Movements: Extraocular movements intact.     Conjunctiva/sclera: Conjunctivae normal.     Pupils: Pupils are equal, round, and reactive to light.  Neck:     Musculoskeletal: Normal range of motion and neck supple.  Cardiovascular:     Rate and Rhythm: Normal rate and regular rhythm.     Heart  sounds: No murmur.  Pulmonary:     Effort: Pulmonary effort is normal. No respiratory distress.     Breath sounds: Normal breath sounds.  Abdominal:     Palpations: Abdomen is soft.     Tenderness: There is no abdominal tenderness.  Musculoskeletal:        General: No swelling.     Comments: Some bruising to the calf.  But no significant swelling or hardness.  Skin:    General: Skin is warm and dry.  Neurological:     Mental Status: She is alert. Mental status is at baseline. She is disoriented.      ED Treatments / Results  Labs (all labs ordered are listed, but only abnormal results are displayed) Labs Reviewed  SARS CORONAVIRUS 2 (TAT 6-24 HRS)  CBC WITH DIFFERENTIAL/PLATELET  BASIC METABOLIC PANEL    EKG EKG Interpretation  Date/Time:  Wednesday October 21 2019 11:41:06 EST Ventricular Rate:  84 PR Interval:    QRS Duration: 128 QT Interval:  438 QTC Calculation: 476 R Axis:   0 Text Interpretation: Sinus rhythm Multiple ventricular premature complexes Nonspecific intraventricular conduction delay Confirmed by Fredia Sorrow [phone]) on 10/21/2019 11:48:26 AM   Radiology Dg Chest Port 1 View  Result Date: 10/21/2019 CLINICAL DATA:  COVID-19 positive, weakness EXAM: PORTABLE CHEST 1 VIEW COMPARISON:  01/28/2019 chest radiograph. FINDINGS: Stable cardiomediastinal silhouette with normal heart size. No pneumothorax. No pleural effusion. Chronic reticular opacity at the left costophrenic angle, unchanged. No acute consolidative airspace disease. Vertebroplasty material overlies the lower thoracic spine. IMPRESSION: No acute cardiopulmonary disease. Chronic reticular opacity at the left costophrenic angle compatible with nonspecific mild fibrosis. Electronically Signed   By: Ilona Sorrel M.D.   On: 10/21/2019 12:49    Procedures Procedures (including critical care time)  Medications Ordered in ED Medications - No data to display   Initial Impression / Assessment and Plan / ED Course  I have reviewed the triage vital signs and the nursing notes.  Pertinent labs & imaging results that were available during my care of the patient were reviewed by me and considered in my medical decision making (see chart for details).        Patient clearly with COVID-19 exposure through her husband.  Patient has dementia fairly bedridden.  There are family members locally.  We will check basic labs if no significant abnormality patient can be discharged back home with family to care for her.  Her husband is requiring admission for complications of [id number].  Patient's chest x-ray is fine patient's oxygen saturations are in the upper 90s there is no tachypnea.  She is not febrile she has no cough.  No apparent symptoms.  Formal Covid testing has been done on  the patient.  Results are pending.  Final Clinical Impressions(s) / ED Diagnoses   Final diagnoses:  Exposure to COVID-19 virus  Dementia without behavioral disturbance, unspecified dementia type [facility])    ED Discharge Orders    None       Fredia Sorrow, MD 10/21/19 1552

## 2019-10-21 NOTE — ED Notes (Signed)
Patient is not ambulatory.  Patient was barely able to stand and turn to get onto the stretcher.  Patient's primary caregiver is in Room 14 - positive Covid.

## 2019-10-21 NOTE — ED Triage Notes (Signed)
Pt  Very poor historian, unsure of why she is here, states " ask my husband" husband is primary caretaker of pt and states that they have both been weak and "felt like we have the flu"

## 2019-10-21 NOTE — Discharge Instructions (Addendum)
Formal Covid testing is been done and is pending.  Stay quarantined at home will be coronavirus test is pending.  If she develops trouble breathing or fevers or any significant symptoms of COVID-19 infection return to be evaluated.  Patient's husband positive for COVID-19 so most likely she has had any exposure.  And has a high likelihood of coming down with it.  Return to the ED with difficulty breathing, chest pain, any other concerns.

## 2019-10-21 NOTE — ED Notes (Signed)
Spoke with pt son, [name], who states he will pick pt up from [facility] ED upon pt D/C.

## 2019-10-21 NOTE — ED Notes (Signed)
Spoke with Tiffany Shannon, son, who states that their other son, [name], is local and may able to take pt. Tiffany Shannon asked if [facility] ED could place pt into a nursing home. Rogene Houston, MD notified of situation.

## 2019-10-21 NOTE — ED Notes (Signed)
Fluid and tylenol given per patient request.

## 2019-10-21 NOTE — ED Provider Notes (Signed)
Patient seen by Dr. Rogene Houston History of dementia here with coronavirus exposure.  Her husband who is her main caretaker is being admitted. Family will be taking care of her at home.  Laboratory studies are pending.  Labs are reassuring.  Troponins are negative. Patient is not hypoxic and has no increased work of breathing.  Nursing staff to contact family.  She will need to quarantine at home.  Return to the ED with difficulty breathing, chest pain, any other concerns.  BP (!) 172/94   Pulse 84   Temp 97.6 F (36.4 C)   Resp (!) 22   Ht 5' (1.524 m)   Wt 63.5 kg   SpO2 97%   BMI 27.34 kg/m     Ezequiel Essex, MD 10/21/19 2211

## 2019-10-22 ENCOUNTER — Inpatient Hospital Stay (HOSPITAL_COMMUNITY)
Admission: EM | Admit: 2019-10-22 | Discharge: 2019-11-20 | DRG: 178 | Disposition: E | Payer: Medicare Other | Attending: Internal Medicine | Admitting: Internal Medicine

## 2019-10-22 ENCOUNTER — Inpatient Hospital Stay (HOSPITAL_COMMUNITY): Payer: Medicare Other

## 2019-10-22 ENCOUNTER — Encounter (HOSPITAL_COMMUNITY): Payer: Self-pay

## 2019-10-22 ENCOUNTER — Other Ambulatory Visit: Payer: Self-pay

## 2019-10-22 DIAGNOSIS — Z66 Do not resuscitate: Secondary | ICD-10-CM | POA: Diagnosis present

## 2019-10-22 DIAGNOSIS — Z7902 Long term (current) use of antithrombotics/antiplatelets: Secondary | ICD-10-CM | POA: Diagnosis not present

## 2019-10-22 DIAGNOSIS — Z8673 Personal history of transient ischemic attack (TIA), and cerebral infarction without residual deficits: Secondary | ICD-10-CM

## 2019-10-22 DIAGNOSIS — Z79899 Other long term (current) drug therapy: Secondary | ICD-10-CM | POA: Diagnosis not present

## 2019-10-22 DIAGNOSIS — Z7401 Bed confinement status: Secondary | ICD-10-CM

## 2019-10-22 DIAGNOSIS — R918 Other nonspecific abnormal finding of lung field: Secondary | ICD-10-CM | POA: Diagnosis not present

## 2019-10-22 DIAGNOSIS — Z885 Allergy status to narcotic agent status: Secondary | ICD-10-CM

## 2019-10-22 DIAGNOSIS — E860 Dehydration: Secondary | ICD-10-CM | POA: Diagnosis present

## 2019-10-22 DIAGNOSIS — R109 Unspecified abdominal pain: Secondary | ICD-10-CM | POA: Diagnosis not present

## 2019-10-22 DIAGNOSIS — R110 Nausea: Secondary | ICD-10-CM | POA: Diagnosis not present

## 2019-10-22 DIAGNOSIS — R531 Weakness: Secondary | ICD-10-CM | POA: Diagnosis not present

## 2019-10-22 DIAGNOSIS — F0390 Unspecified dementia without behavioral disturbance: Secondary | ICD-10-CM | POA: Diagnosis present

## 2019-10-22 DIAGNOSIS — R112 Nausea with vomiting, unspecified: Secondary | ICD-10-CM

## 2019-10-22 DIAGNOSIS — E876 Hypokalemia: Secondary | ICD-10-CM | POA: Diagnosis present

## 2019-10-22 DIAGNOSIS — J984 Other disorders of lung: Secondary | ICD-10-CM | POA: Diagnosis not present

## 2019-10-22 DIAGNOSIS — E872 Acidosis: Secondary | ICD-10-CM | POA: Diagnosis not present

## 2019-10-22 DIAGNOSIS — R1115 Cyclical vomiting syndrome unrelated to migraine: Secondary | ICD-10-CM | POA: Diagnosis present

## 2019-10-22 DIAGNOSIS — I10 Essential (primary) hypertension: Secondary | ICD-10-CM | POA: Diagnosis present

## 2019-10-22 DIAGNOSIS — Z90710 Acquired absence of both cervix and uterus: Secondary | ICD-10-CM

## 2019-10-22 DIAGNOSIS — Z993 Dependence on wheelchair: Secondary | ICD-10-CM | POA: Diagnosis not present

## 2019-10-22 DIAGNOSIS — Z8249 Family history of ischemic heart disease and other diseases of the circulatory system: Secondary | ICD-10-CM

## 2019-10-22 DIAGNOSIS — U071 COVID-19: Secondary | ICD-10-CM | POA: Diagnosis not present

## 2019-10-22 HISTORY — DX: COVID-19: U07.1

## 2019-10-22 LAB — URINALYSIS, ROUTINE W REFLEX MICROSCOPIC
Bilirubin Urine: NEGATIVE
Glucose, UA: NEGATIVE mg/dL
Ketones, ur: 80 mg/dL — AB
Leukocytes,Ua: NEGATIVE
Nitrite: NEGATIVE
Protein, ur: 100 mg/dL — AB
Specific Gravity, Urine: 1.017 (ref 1.005–1.030)
pH: 5 (ref 5.0–8.0)

## 2019-10-22 LAB — COMPREHENSIVE METABOLIC PANEL
ALT: 11 U/L (ref 0–44)
AST: 14 U/L — ABNORMAL LOW (ref 15–41)
Albumin: 4.1 g/dL (ref 3.5–5.0)
Alkaline Phosphatase: 82 U/L (ref 38–126)
Anion gap: 20 — ABNORMAL HIGH (ref 5–15)
BUN: 23 mg/dL (ref 8–23)
CO2: 18 mmol/L — ABNORMAL LOW (ref 22–32)
Calcium: 8.9 mg/dL (ref 8.9–10.3)
Chloride: 100 mmol/L (ref 98–111)
Creatinine, Ser: 0.67 mg/dL (ref 0.44–1.00)
GFR calc Af Amer: >60 mL/min (ref >60)
GFR calc non Af Amer: >60 mL/min (ref >60)
Glucose, Bld: 139 mg/dL — ABNORMAL HIGH (ref 70–99)
Potassium: 3.7 mmol/L (ref 3.5–5.1)
Sodium: 138 mmol/L (ref 135–145)
Total Bilirubin: 1.3 mg/dL — ABNORMAL HIGH (ref 0.3–1.2)
Total Protein: 7.5 g/dL (ref 6.5–8.1)

## 2019-10-22 LAB — CBC WITH DIFFERENTIAL/PLATELET
Abs Immature Granulocytes: 0.06 10*3/uL (ref 0.00–0.07)
Basophils Absolute: 0 10*3/uL (ref 0.0–0.1)
Basophils Relative: 0 %
Eosinophils Absolute: 0 10*3/uL (ref 0.0–0.5)
Eosinophils Relative: 0 %
HCT: 44 % (ref 36.0–46.0)
Hemoglobin: 13.8 g/dL (ref 12.0–15.0)
Immature Granulocytes: 0 %
Lymphocytes Relative: 10 %
Lymphs Abs: 1.4 10*3/uL (ref 0.7–4.0)
MCH: 29.7 pg (ref 26.0–34.0)
MCHC: 31.4 g/dL (ref 30.0–36.0)
MCV: 94.6 fL (ref 80.0–100.0)
Monocytes Absolute: 0.7 10*3/uL (ref 0.1–1.0)
Monocytes Relative: 5 %
Neutro Abs: 11.9 10*3/uL — ABNORMAL HIGH (ref 1.7–7.7)
Neutrophils Relative %: 85 %
Platelets: 392 10*3/uL (ref 150–400)
RBC: 4.65 MIL/uL (ref 3.87–5.11)
RDW: 14.2 % (ref 11.5–15.5)
WBC: 14.1 10*3/uL — ABNORMAL HIGH (ref 4.0–10.5)
nRBC: 0 % (ref 0.0–0.2)

## 2019-10-22 LAB — PROCALCITONIN: Procalcitonin: <0.1 ng/mL

## 2019-10-22 LAB — SARS CORONAVIRUS 2 (TAT 6-24 HRS): SARS Coronavirus 2: POSITIVE — AB

## 2019-10-22 LAB — LIPASE, BLOOD: Lipase: 17 U/L (ref 11–51)

## 2019-10-22 MED ORDER — ALBUTEROL SULFATE HFA 108 (90 BASE) MCG/ACT IN AERS
2.0000 | INHALATION_SPRAY | Freq: Four times a day (QID) | RESPIRATORY_TRACT | Status: DC
  Administered 2019-10-22 – 2019-10-23 (×4): 2 via RESPIRATORY_TRACT
  Filled 2019-10-22: qty 6.7

## 2019-10-22 MED ORDER — ONDANSETRON HCL 4 MG/2ML IJ SOLN
4.0000 mg | Freq: Once | INTRAMUSCULAR | Status: AC
  Administered 2019-10-22: 4 mg via INTRAVENOUS
  Filled 2019-10-22: qty 2

## 2019-10-22 MED ORDER — GUAIFENESIN-DM 100-10 MG/5ML PO SYRP: 10.0000 mL | ORAL_SOLUTION | ORAL | Status: DC | PRN

## 2019-10-22 MED ORDER — ONDANSETRON HCL 4 MG/2ML IJ SOLN
4.0000 mg | Freq: Once | INTRAMUSCULAR | Status: AC
  Administered 2019-10-22: 09:00:00 4 mg via INTRAVENOUS
  Filled 2019-10-22: qty 2

## 2019-10-22 MED ORDER — DARIFENACIN HYDROBROMIDE ER 7.5 MG PO TB24
7.5000 mg | ORAL_TABLET | Freq: Every day | ORAL | Status: DC
  Administered 2019-10-23: 7.5 mg via ORAL
  Filled 2019-10-22: qty 1

## 2019-10-22 MED ORDER — ZINC SULFATE 220 (50 ZN) MG PO CAPS
220.0000 mg | ORAL_CAPSULE | Freq: Every day | ORAL | Status: DC
  Administered 2019-10-22 – 2019-10-23 (×2): 220 mg via ORAL
  Filled 2019-10-22 (×2): qty 1

## 2019-10-22 MED ORDER — POLYETHYLENE GLYCOL 3350 17 G PO PACK: 17.0000 g | PACK | Freq: Every day | ORAL | Status: DC | PRN

## 2019-10-22 MED ORDER — TRAZODONE HCL 50 MG PO TABS: 50.0000 mg | ORAL_TABLET | Freq: Every evening | ORAL | Status: DC | PRN

## 2019-10-22 MED ORDER — SODIUM CHLORIDE 0.9% FLUSH
3.0000 mL | Freq: Two times a day (BID) | INTRAVENOUS | Status: DC
  Administered 2019-10-22 – 2019-10-23 (×2): 3 mL via INTRAVENOUS

## 2019-10-22 MED ORDER — SIMVASTATIN 10 MG PO TABS
10.0000 mg | ORAL_TABLET | Freq: Every day | ORAL | Status: DC
  Filled 2019-10-22: qty 1

## 2019-10-22 MED ORDER — SODIUM CHLORIDE 0.9 % IV SOLN
Freq: Once | INTRAVENOUS | Status: AC
  Administered 2019-10-22: 09:00:00 via INTRAVENOUS

## 2019-10-22 MED ORDER — ATORVASTATIN CALCIUM 20 MG PO TABS
20.0000 mg | ORAL_TABLET | Freq: Every day | ORAL | Status: DC
  Administered 2019-10-22: 20 mg via ORAL
  Filled 2019-10-22 (×3): qty 1

## 2019-10-22 MED ORDER — CLOPIDOGREL BISULFATE 75 MG PO TABS
75.0000 mg | ORAL_TABLET | Freq: Every morning | ORAL | Status: DC
  Administered 2019-10-23: 75 mg via ORAL
  Filled 2019-10-22: qty 1

## 2019-10-22 MED ORDER — VITAMIN B-1 100 MG PO TABS
100.0000 mg | ORAL_TABLET | Freq: Every day | ORAL | Status: DC
  Administered 2019-10-22 – 2019-10-23 (×2): 100 mg via ORAL
  Filled 2019-10-22 (×2): qty 1

## 2019-10-22 MED ORDER — ONDANSETRON HCL 4 MG/2ML IJ SOLN
4.0000 mg | Freq: Four times a day (QID) | INTRAMUSCULAR | Status: DC | PRN
  Administered 2019-10-23: 4 mg via INTRAVENOUS
  Filled 2019-10-22: qty 2

## 2019-10-22 MED ORDER — SODIUM CHLORIDE 0.9% FLUSH: 3.0000 mL | INTRAVENOUS | Status: DC | PRN

## 2019-10-22 MED ORDER — FOLIC ACID 1 MG PO TABS
1.0000 mg | ORAL_TABLET | Freq: Every day | ORAL | Status: DC
  Administered 2019-10-22 – 2019-10-23 (×2): 1 mg via ORAL
  Filled 2019-10-22 (×2): qty 1

## 2019-10-22 MED ORDER — SODIUM CHLORIDE 0.9 % IV BOLUS
500.0000 mL | Freq: Once | INTRAVENOUS | Status: AC
  Administered 2019-10-22: 500 mL via INTRAVENOUS

## 2019-10-22 MED ORDER — ONDANSETRON HCL 4 MG PO TABS: 4.0000 mg | ORAL_TABLET | Freq: Four times a day (QID) | ORAL | Status: DC | PRN

## 2019-10-22 MED ORDER — ACETAMINOPHEN 650 MG RE SUPP: 650.0000 mg | Freq: Four times a day (QID) | RECTAL | Status: DC | PRN

## 2019-10-22 MED ORDER — HYDROCOD POLST-CPM POLST ER 10-8 MG/5ML PO SUER: 5.0000 mL | Freq: Two times a day (BID) | ORAL | Status: DC | PRN

## 2019-10-22 MED ORDER — ACETAMINOPHEN 325 MG PO TABS: 650.0000 mg | ORAL_TABLET | Freq: Four times a day (QID) | ORAL | Status: DC | PRN

## 2019-10-22 MED ORDER — VITAMIN C 500 MG PO TABS
500.0000 mg | ORAL_TABLET | Freq: Every day | ORAL | Status: DC
  Administered 2019-10-22 – 2019-10-23 (×2): 500 mg via ORAL
  Filled 2019-10-22 (×2): qty 1

## 2019-10-22 MED ORDER — HEPARIN SODIUM (PORCINE) 5000 UNIT/ML IJ SOLN
5000.0000 [IU] | Freq: Three times a day (TID) | INTRAMUSCULAR | Status: DC
  Administered 2019-10-22 – 2019-10-23 (×4): 5000 [IU] via SUBCUTANEOUS
  Filled 2019-10-22 (×4): qty 1

## 2019-10-22 MED ORDER — ADULT MULTIVITAMIN W/MINERALS CH
1.0000 | ORAL_TABLET | Freq: Every day | ORAL | Status: DC
  Administered 2019-10-22 – 2019-10-23 (×2): 1 via ORAL
  Filled 2019-10-22 (×2): qty 1

## 2019-10-22 MED ORDER — SODIUM CHLORIDE 0.9 % IV SOLN
INTRAVENOUS | Status: DC
  Administered 2019-10-22: 23:00:00 via INTRAVENOUS

## 2019-10-22 MED ORDER — SODIUM CHLORIDE 0.9 % IV SOLN: 250.0000 mL | INTRAVENOUS | Status: DC | PRN

## 2019-10-22 MED ORDER — PANTOPRAZOLE SODIUM 40 MG PO TBEC
40.0000 mg | DELAYED_RELEASE_TABLET | Freq: Two times a day (BID) | ORAL | Status: DC
  Administered 2019-10-22 – 2019-10-23 (×3): 40 mg via ORAL
  Filled 2019-10-22 (×3): qty 1

## 2019-10-22 NOTE — ED Notes (Signed)
ED TO INPATIENT HANDOFF REPORT  ED Nurse Name and Phone #: [phone]  S Name/Age/Gender Tiffany Shannon 85 y.o. female Room/Bed: APA06/APA06  Code Status   Code Status: DNR  Home/SNF/Other Home Patient oriented to: self and place Is this baseline? Yes   Triage Complete: Triage complete  Chief Complaint Emesis  Triage Note Pt brought back in this morning after being seen yesterday. Husband who is caregiver is in covid hosp and pt has covid. Family states sick all night vomiting   Allergies Allergies  Allergen Reactions  . Codeine Nausea And Vomiting    Level of Care/Admitting Diagnosis ED Disposition    ED Disposition Condition [facility] Area: [facility] [id number]  Level of Care: Med-Surg [16]  Covid Evaluation: Confirmed COVID Positive  Diagnosis: [id number] virus detected [id number]  Admitting Physician: Morrison Old  Attending Physician: Morrison Old  Estimated length of stay: 3 - 4 days  Certification:: I certify this patient will need inpatient services for at least 2 midnights  Bed request comments: med  PT Class (Do Not Modify): Inpatient [101]  PT Acc Code (Do Not Modify): Private [1]       B Medical/Surgery History Past Medical History:  Diagnosis Date  . COVID-19   . Hypertension    Past Surgical History:  Procedure Laterality Date  . ABDOMINAL HYSTERECTOMY    . APPENDECTOMY    . TONSILLECTOMY       A IV Location/Drains/Wounds Patient Lines/Drains/Airways Status   Active Line/Drains/Airways    Name:   Placement date:   Placement time:   Site:   Days:   Peripheral IV 11/17/2019 Left Antecubital   10/29/2019    0910    Antecubital   less than 1   External Urinary Catheter   11/04/2019    1716    -   less than 1          Intake/Output Last 24 hours  Intake/Output Summary (Last 24 hours) at 11/14/2019 1844 Last data filed at 10/25/2019 1710 Gross per 24 hour  Intake 500 ml  Output 1 ml   Net 499 ml    Labs/Imaging Results for orders placed or performed during the hospital encounter of 10/29/2019 (from the past 48 hour(s))  CBC with Differential     Status: Abnormal   Collection Time: 10/26/2019  9:14 AM  Result Value Ref Range   WBC 14.1 (H) 4.0 - 10.5 K/uL   RBC 4.65 3.87 - 5.11 MIL/uL   Hemoglobin 13.8 12.0 - 15.0 g/dL   HCT 44.0 36.0 - 46.0 %   MCV 94.6 80.0 - 100.0 fL   MCH 29.7 26.0 - 34.0 pg   MCHC 31.4 30.0 - 36.0 g/dL   RDW 14.2 11.5 - 15.5 %   Platelets 392 150 - 400 K/uL   nRBC 0.0 0.0 - 0.2 %   Neutrophils Relative % 85 %   Neutro Abs 11.9 (H) 1.7 - 7.7 K/uL   Lymphocytes Relative 10 %   Lymphs Abs 1.4 0.7 - 4.0 K/uL   Monocytes Relative 5 %   Monocytes Absolute 0.7 0.1 - 1.0 K/uL   Eosinophils Relative 0 %   Eosinophils Absolute 0.0 0.0 - 0.5 K/uL   Basophils Relative 0 %   Basophils Absolute 0.0 0.0 - 0.1 K/uL   Immature Granulocytes 0 %   Abs Immature Granulocytes 0.06 0.00 - 0.07 K/uL    Comment: Performed at [facility], [address]  [place], [place] [address]  Comprehensive metabolic panel     Status: Abnormal   Collection Time: 10/28/2019  9:14 AM  Result Value Ref Range   Sodium 138 135 - 145 mmol/L   Potassium 3.7 3.5 - 5.1 mmol/L    Comment: DELTA CHECK NOTED   Chloride 100 98 - 111 mmol/L   CO2 18 (L) 22 - 32 mmol/L   Glucose, Bld 139 (H) 70 - 99 mg/dL   BUN 23 8 - 23 mg/dL   Creatinine, Ser 0.67 0.44 - 1.00 mg/dL   Calcium 8.9 8.9 - 10.3 mg/dL   Total Protein 7.5 6.5 - 8.1 g/dL   Albumin 4.1 3.5 - 5.0 g/dL   AST 14 (L) 15 - 41 U/L   ALT 11 0 - 44 U/L   Alkaline Phosphatase 82 38 - 126 U/L   Total Bilirubin 1.3 (H) 0.3 - 1.2 mg/dL   GFR calc non Af Amer >60 >60 mL/min   GFR calc Af Amer >60 >60 mL/min   Anion gap 20 (H) 5 - 15    Comment: Performed at [facility], [address] [place], [place] [address]  Lipase, blood     Status: None   Collection Time: 10/25/2019  9:14 AM  Result Value Ref Range   Lipase 17 11 - 51 U/L     Comment: Performed at [facility], [address], [place], [place] [address]  Procalcitonin - Baseline     Status: None   Collection Time: 11/07/2019 10:49 AM  Result Value Ref Range   Procalcitonin <0.10 ng/mL    Comment:        Interpretation: PCT (Procalcitonin) <= 0.5 ng/mL: Systemic infection (sepsis) is not likely. Local bacterial infection is possible. (NOTE)       Sepsis PCT Algorithm           Lower Respiratory Tract                                      Infection PCT Algorithm    ----------------------------     ----------------------------         PCT < 0.25 ng/mL                PCT < 0.10 ng/mL         Strongly encourage             Strongly discourage   discontinuation of antibiotics    initiation of antibiotics    ----------------------------     -----------------------------       PCT 0.25 - 0.50 ng/mL            PCT 0.10 - 0.25 ng/mL               OR       >80% decrease in PCT            Discourage initiation of                                            antibiotics      Encourage discontinuation           of antibiotics    ----------------------------     -----------------------------         PCT >= 0.50 ng/mL  PCT 0.26 - 0.50 ng/mL               AND        <80% decrease in PCT             Encourage initiation of                                             antibiotics       Encourage continuation           of antibiotics    ----------------------------     -----------------------------        PCT >= 0.50 ng/mL                  PCT > 0.50 ng/mL               AND         increase in PCT                  Strongly encourage                                      initiation of antibiotics    Strongly encourage escalation           of antibiotics                                     -----------------------------                                           PCT <= 0.25 ng/mL                                                 OR                                         > 80% decrease in PCT                                     Discontinue / Do not initiate                                             antibiotics Performed at [facility], [address] [place], [place] [address]   Urinalysis, Routine w reflex microscopic     Status: Abnormal   Collection Time: 11/01/2019 11:34 AM  Result Value Ref Range   Color, Urine YELLOW YELLOW   APPearance CLOUDY (A) CLEAR   Specific Gravity, Urine 1.017 1.005 - 1.030   pH 5.0 5.0 - 8.0   Glucose, UA NEGATIVE NEGATIVE mg/dL   Hgb urine dipstick SMALL (A) NEGATIVE   Bilirubin  Urine NEGATIVE NEGATIVE   Ketones, ur 80 (A) NEGATIVE mg/dL   Protein, ur 100 (A) NEGATIVE mg/dL   Nitrite NEGATIVE NEGATIVE   Leukocytes,Ua NEGATIVE NEGATIVE   RBC / HPF 11-20 0 - 5 RBC/hpf   WBC, UA 21-50 0 - 5 WBC/hpf   Bacteria, UA RARE (A) NONE SEEN   Squamous Epithelial / LPF 0-5 0 - 5   Mucus PRESENT     Comment: Performed at [facility], [address] [place], [place] [address]  Culture, blood (Routine X 2) w Reflex to ID Panel     Status: None (Preliminary result)   Collection Time: 11/19/2019  3:44 PM   Specimen: Right Antecubital; Blood  Result Value Ref Range   Specimen Description RIGHT ANTECUBITAL    Special Requests      BOTTLES DRAWN AEROBIC AND ANAEROBIC Blood Culture adequate volume Performed at [facility], [address] [place], [place] [address]    Culture PENDING    Report Status PENDING   Culture, blood (Routine X 2) w Reflex to ID Panel     Status: None (Preliminary result)   Collection Time: 11/12/2019  3:44 PM   Specimen: BLOOD RIGHT FOREARM  Result Value Ref Range   Specimen Description BLOOD RIGHT FOREARM    Special Requests      BOTTLES DRAWN AEROBIC AND ANAEROBIC Blood Culture adequate volume Performed at [facility], [address], [place],  [address]    Culture PENDING    Report Status PENDING    Dg Chest Port 1 View  Result Date: 11/07/2019 CLINICAL DATA:  COVID-19 positive.   Nausea, vomiting and diarrhea. EXAM: PORTABLE CHEST 1 VIEW COMPARISON:  10/21/2019 and 09/08/2019 FINDINGS: Lungs are adequately inflated with possible subtle hazy patchy density over the right midlung slightly more apparent. No effusion. Cardiomediastinal silhouette and remainder of the exam is unchanged. IMPRESSION: Subtle hazy density over the right midlung slightly more apparent and may be due to infection. Electronically Signed   By: Marin Olp M.D.   On: 11/08/2019 11:25   Dg Chest Port 1 View  Result Date: 10/21/2019 CLINICAL DATA:  COVID-19 positive, weakness EXAM: PORTABLE CHEST 1 VIEW COMPARISON:  01/28/2019 chest radiograph. FINDINGS: Stable cardiomediastinal silhouette with normal heart size. No pneumothorax. No pleural effusion. Chronic reticular opacity at the left costophrenic angle, unchanged. No acute consolidative airspace disease. Vertebroplasty material overlies the lower thoracic spine. IMPRESSION: No acute cardiopulmonary disease. Chronic reticular opacity at the left costophrenic angle compatible with nonspecific mild fibrosis. Electronically Signed   By: Ilona Sorrel M.D.   On: 10/21/2019 12:49    Pending Labs Unresulted Labs (From admission, onward)    Start     Ordered   10/23/19 0600  ABO/Rh  Once,   STAT     11/15/2019 1517   10/23/19 0500  Procalcitonin  Daily,   R     11/17/2019 1045   10/23/19 0500  CBC with Differential/Platelet  Daily,   R     10/21/2019 1505   10/23/19 0500  Comprehensive metabolic panel  Daily,   R     10/23/2019 1505   10/23/19 0500  C-reactive protein  Daily,   R     10/30/2019 1505   10/23/19 0500  D-dimer, quantitative (not at [facility])  Daily,   R     11/15/2019 1505   10/23/19 0500  Ferritin  Daily,   R     10/20/2019 1505   10/23/19 0500  Magnesium  Daily,  R     11/11/2019 1505   10/23/19 0500  Phosphorus  Daily,   R     11/05/2019 1505   11/12/2019 1420  Culture, Urine  Once,   STAT    Question:  Patient immune status  Answer:  Normal   10/27/2019  1419   Signed and Held  Basic metabolic panel  Tomorrow morning,   R     Signed and Held   Signed and Held  CBC  Tomorrow morning,   R     Signed and Held          Vitals/Pain Today's Vitals   10/21/2019 1600 11/04/2019 1700 10/26/2019 1706 11/18/2019 1800  BP: (!) 152/72 (!) 156/103  (!) 160/76  Pulse:    88  Resp:   (!) 22 16  Temp:      TempSrc:      SpO2:  99%  97%  Weight:        Isolation Precautions Airborne and Contact precautions  Medications Medications  simvastatin (ZOCOR) tablet 10 mg (0 mg Oral Hold 10/31/2019 1801)  albuterol (VENTOLIN HFA) 108 (90 Base) MCG/ACT inhaler 2 puff (2 puffs Inhalation Given 11/12/2019 1707)  guaiFENesin-dextromethorphan (ROBITUSSIN DM) 100-10 MG/5ML syrup 10 mL (has no administration in time range)  chlorpheniramine-HYDROcodone (TUSSIONEX) 10-8 MG/5ML suspension 5 mL (has no administration in time range)  vitamin C (ASCORBIC ACID) tablet 500 mg (500 mg Oral Given 10/25/2019 1708)  zinc sulfate capsule 220 mg (220 mg Oral Given [id number])  folic acid (FOLVITE) tablet 1 mg (1 mg Oral Given 11/15/2019 1709)  multivitamin with minerals tablet 1 tablet (1 tablet Oral Given 10/23/2019 1708)  thiamine (VITAMIN B-1) tablet 100 mg (100 mg Oral Given 11/07/2019 1709)  0.9 %  sodium chloride infusion ( Intravenous Rate/Dose Change 10/27/2019 1708)  sodium chloride 0.9 % bolus 500 mL (0 mLs Intravenous Stopped 11/06/2019 1015)  0.9 %  sodium chloride infusion ( Intravenous Stopped 11/11/2019 1708)  ondansetron (ZOFRAN) injection 4 mg (4 mg Intravenous Given 11/04/2019 0913)  ondansetron (ZOFRAN) injection 4 mg (4 mg Intravenous Given 10/23/2019 1801)    Mobility manual wheelchair High fall risk   Focused Assessments    R Recommendations: See Admitting Provider Note  Report given to:   Additional Notes:

## 2019-10-22 NOTE — Care Management Obs Status (Signed)
[facility] NOTIFICATION   Patient Details  Name: Tiffany Shannon MRN: [id number] Date of Birth: 1923/08/12   Medicare Observation Status Notification Given:  Other (see comment)(Notice delivered to telephonically and delivered to patients room)    [place], LCSW 10/20/2019, 2:27 PM

## 2019-10-22 NOTE — TOC Initial Note (Signed)
Transition of Care [facility]) - Initial/Assessment Note    Patient Details  Name: Tiffany Shannon MRN: [id number] Date of Birth: [birth year]  Transition of [facility]) CM/SW Contact:    Kerolos Nehme Dimitri Ped, LCSW Phone Number: 10/31/2019, 2:11 PM  Clinical Narrative:      CSW in contact with patients son [name] who explains that patient resides at home with her husband, who is currently being hospitalized for COVID. Pt is also testing positive for COVID. [name] explains that patient is unable to walk. [name] goes into detail and explains that although he and his sister frequently visit the patient, she currently is not receiving 24 hour supervision. [name] states that the patients daughter Juliann Pulse may be more helpful in making decisions concerning care. [name] was notified of patients observation status and expressed understanding.   CSW in contact with [facility] [place]: [phone]. Juliann Pulse confirms much of the information provided earlier by [name]. Juliann Pulse stats that she has a brother who is traveling into town from [place] to assist family in caring for patient. Juliann Pulse states that she would like to hire private nurse aid to assist in caring for patient at home. Juliann Pulse was also educated on home health services. Juliann Pulse goes into detail and states that her fathers health is deteriorating and is struggling in caring for patient. Juliann Pulse explains that patient does not walk nor do they currently have any DME or home services available currently.   TOC team will continue to follow patient for any discharge related needs.   [facility] Transitions of Care  Clinical Social Worker  Ph: [phone]  Expected Discharge Plan: [place] [facility] Barriers to Discharge: Continued Medical Work up   Patient Goals and CMS Choice Patient states their goals for this hospitalization and ongoing recovery are:: for patient to be treated for Covid symptoms and then to discharge home with private duty aid to assist  in the home   Choice offered to / list presented to : Adult Children  Expected Discharge Plan and Services Expected Discharge Plan: [place]   Discharge Planning Services: CM Consult Post Acute Care Choice: Home Health, Durable Medical Equipment Living arrangements for the past 2 months: Single Family Home                           HH Arranged: PT, OT, Social Work, Therapist, sports, Nurse's Aide          Prior Living Arrangements/Services Living arrangements for the past 2 months: [place] with:: Spouse(Spouse currently hospitalized) Patient language and need for interpreter reviewed:: Yes Do you feel safe going back to the place where you live?: Yes      Need for Family Participation in Patient Care: Yes (Comment) Care giver support system in place?: Yes (comment)   Criminal Activity/Legal Involvement Pertinent to Current Situation/Hospitalization: No - Comment as needed  Activities of Daily Living      Permission Sought/Granted   Permission granted to share information with : Yes, Verbal Permission Granted  Share Information with NAME: Suezanne Jacquet     Permission granted to share info w Relationship: Daughter  Permission granted to share info w Contact Information: PH: [phone]  Emotional Assessment Appearance:: Other (Comment Required(TOC assessment conducted telephonically)     Orientation: : Fluctuating Orientation (Suspected and/or reported [place])   Psych Involvement: No (comment)  Admission diagnosis:  Emesis Patient Active Problem List   Diagnosis Date  Noted  . COVID-19 virus detected 10/30/2019  . Acute encephalopathy 01/30/2019  . Acute metabolic encephalopathy [id number]  . Transient global amnesia 01/28/2019  . Essential hypertension 01/28/2019  . Hyponatremia 01/28/2019   PCP:  [name], MD Pharmacy:   [facility] [id number] [place]  [id number] Phone: [phone] Fax: [phone]     Social Determinants of Health (SDOH) Interventions    Readmission Risk Interventions Readmission Risk Prevention Plan 01/30/2019  Medication Screening Complete  Transportation Screening Complete  Some recent data might be hidden

## 2019-10-22 NOTE — ED Notes (Signed)
Pt vomited small amount into emesis bag and on shirt. Pt gown changed and draw sheet. Orders requested for nausea.

## 2019-10-22 NOTE — ED Provider Notes (Addendum)
[facility] EMERGENCY DEPARTMENT Provider Note   CSN: [id number] Arrival date & time: 10/20/2019  [id number]     History   Chief Complaint Chief Complaint  Patient presents with  . Emesis    HPI Tiffany Shannon is a 85 y.o. female.     Level 5 caveat for dementia.  Chief complaint nausea, vomiting, abdominal pain.  Patient seen in the ED yesterday for weakness.  Husband recently admitted to the [facility] system for Covid positivity.  Husband is primary caregiver for the patient.  Covid test from yesterday was positive.  Further history from daughter Juliann Pulse at [phone].  Patient has been vomiting a brown liquid all night and specifically asked to come to the emergency department.     Past Medical History:  Diagnosis Date  . COVID-19   . Hypertension     Patient Active Problem List   Diagnosis Date Noted  . Acute encephalopathy 01/30/2019  . Acute metabolic encephalopathy [id number]  . Transient global amnesia 01/28/2019  . Essential hypertension 01/28/2019  . Hyponatremia 01/28/2019    Past Surgical History:  Procedure Laterality Date  . ABDOMINAL HYSTERECTOMY    . APPENDECTOMY    . TONSILLECTOMY       OB History   No obstetric history on file.      Home Medications    Prior to Admission medications   Medication Sig Start Date End Date Taking? Authorizing Provider  clopidogrel (PLAVIX) 75 MG tablet Take 75 mg by mouth every morning. 07/29/19   [provider]  pantoprazole (PROTONIX) 40 MG tablet Take 40 mg by mouth 2 (two) times daily. 07/29/19   [provider]  simvastatin (ZOCOR) 10 MG tablet Take 1 tablet (10 mg total) by mouth daily at 6 PM for 30 days. 01/30/19 03/01/19  Manuella Ghazi, Pratik D, DO  solifenacin (VESICARE) 5 MG tablet Take 5 mg by mouth daily. 07/29/19   [provider]    Family History No family history on file.  Social History Social History   Tobacco Use  . Smoking status: Never Smoker  . Smokeless tobacco: Never  Used  Substance Use Topics  . Alcohol use: Never    Frequency: Never  . Drug use: Never     Allergies   Codeine   Review of Systems Review of Systems  Unable to perform ROS: Dementia     Physical Exam Updated Vital Signs BP (!) 188/110 (BP Location: Left Arm)   Pulse 92   Temp 98.3 F (36.8 C) (Rectal)   Resp 20   Wt 63 kg   SpO2 99%   BMI 27.13 kg/m   Physical Exam Vitals signs and nursing note reviewed.  Constitutional:      Appearance: She is well-developed.     Comments: Able to answer questions.  HENT:     Head: Normocephalic and atraumatic.  Eyes:     Conjunctiva/sclera: Conjunctivae normal.  Neck:     Musculoskeletal: Neck supple.  Cardiovascular:     Rate and Rhythm: Normal rate and regular rhythm.  Pulmonary:     Effort: Pulmonary effort is normal.     Breath sounds: Normal breath sounds.  Abdominal:     Comments: Tender mid lower abd  Musculoskeletal: Normal range of motion.  Skin:    General: Skin is warm and dry.  Neurological:     General: No focal deficit present.     Mental Status: She is alert and oriented to person, place, and time.  Psychiatric:  Behavior: Behavior normal.      ED Treatments / Results  Labs (all labs ordered are listed, but only abnormal results are displayed) Labs Reviewed  CBC WITH DIFFERENTIAL/PLATELET  COMPREHENSIVE METABOLIC PANEL  LIPASE, BLOOD  URINALYSIS, ROUTINE W REFLEX MICROSCOPIC    EKG None  Radiology Dg Chest Port 1 View  Result Date: 10/21/2019 CLINICAL DATA:  COVID-19 positive, weakness EXAM: PORTABLE CHEST 1 VIEW COMPARISON:  01/28/2019 chest radiograph. FINDINGS: Stable cardiomediastinal silhouette with normal heart size. No pneumothorax. No pleural effusion. Chronic reticular opacity at the left costophrenic angle, unchanged. No acute consolidative airspace disease. Vertebroplasty material overlies the lower thoracic spine. IMPRESSION: No acute cardiopulmonary disease. Chronic  reticular opacity at the left costophrenic angle compatible with nonspecific mild fibrosis. Electronically Signed   By: Ilona Sorrel M.D.   On: 10/21/2019 12:49    Procedures Procedures (including critical care time)  Medications Ordered in ED Medications  sodium chloride 0.9 % bolus 500 mL (has no administration in time range)  0.9 %  sodium chloride infusion (has no administration in time range)     Initial Impression / Assessment and Plan / ED Course  I have reviewed the triage vital signs and the nursing notes.  Pertinent labs & imaging results that were available during my care of the patient were reviewed by me and considered in my medical decision making (see chart for details).        Second visit to the emergency department 2 days.  Chief complaint nausea, vomiting, abdominal pain.  Covid test positive.  IV fluids, basic labs, will need admission.  1310: Rediscussed with hospitalist.  Will transfer to [facility].  CRITICAL CARE Performed by: Nat Christen Total critical care time: 30 minutes Critical care time was exclusive of separately billable procedures and treating other patients. Critical care was necessary to treat or prevent imminent or life-threatening deterioration. Critical care was time spent personally by me on the following activities: development of treatment plan with patient and/or surrogate as well as nursing, discussions with consultants, evaluation of patient's response to treatment, examination of patient, obtaining history from patient or surrogate, ordering and performing treatments and interventions, ordering and review of laboratory studies, ordering and review of radiographic studies, pulse oximetry and re-evaluation of patient's condition.   YANNIS GROSSMAN was evaluated in Emergency Department on 10/29/2019 for the symptoms described in the history of present illness. She was evaluated in the context of the global COVID-19 pandemic, which  necessitated consideration that the patient might be at risk for infection with the SARS-CoV-2 virus that causes COVID-19. Institutional protocols and algorithms that pertain to the evaluation of patients at risk for COVID-19 are in a state of rapid change based on information released by regulatory bodies including the CDC and federal and state organizations. These policies and algorithms were followed during the patient's care in the ED.  Final Clinical Impressions(s) / ED Diagnoses   Final diagnoses:  Abdominal pain, unspecified abdominal location  Nausea and vomiting, intractability of vomiting not specified, unspecified vomiting type  COVID-19 virus detected    ED Discharge Orders    None       Nat Christen, MD 10/20/2019 [id number]    Nat Christen, MD 10/29/2019 1309

## 2019-10-22 NOTE — H&P (Signed)
Patient Demographics:    Tiffany Shannon, is a 85 y.o. female  MRN: [id number]   DOB - [birth year]  Admit Date - 11/15/2019  Outpatient Primary MD for the patient is [name], MD   Assessment & Plan:    Principal Problem:   COVID-19 virus detected Active Problems:   Essential hypertension   Emesis, persistent   H/O: CVA (cerebrovascular accident)   1) intractable emesis----suspect COVID-19 related ,  IV antiemetics, IV fluids, sips and then advance to clear liquids as tolerated -Monitor electrolytes and renal function closely -Last BM 10/26/2019 if emesis persist consider abdominal imaging to rule out obstructive findings  2)  COVID-19 infection--right midlung density, no hypoxia, patient was cough that is slightly productive -O2 sats 97 to 90% on room air -Continue supportive treatment - the treatment plan and use of medications  for treatment of COVID-19 infection and possible side effects were discussed with patient/family,  explained that there is No proven definitive treatment for COVID-19 infection, any medications used here are based on published clinical articles/anecdotal data which at times and not yet peer-reviewed or randomized control trials. Complete risks and long-term side effects are unknown, however in the best clinical judgment they seem to be of some clinical benefit . --  -patient currently does NOT meet criteria for initiation of Remdesivir AND Decadron/Steroid therapy per protocol --- --Check and trend fibrinogen, CRP, pro calcitonin, CBC, BMP, d-dimer, LDH, ferritin and LFTs -Follow serial chest x-rays and ABGs as indicated --- Encourage prone positioning for More than 16 hours/day in increments of 2 to 3 hours at a time if able to tolerate --Attempt to maintain euvolemic state --Zinc and  vitamin C as ordered -Albuterol inhaler as needed  3)Social/Ethics-- pt is a DNR/DNI, d/w daughter [name] [id number],  -Patient's son [name] defers decisions to patient's daughter -Patient spouse currently admitted with hypoxic respiratory failure secondary to Covid 19 respiratory infection  4) skin findings suggestive of neurofibromatosis--- prior diagnosis  5) generalized weakness/deconditioning--- at baseline patient is wheelchair-bound apparently due to prior back surgery and later compounded by subsequent stroke  6)H/o CVA-see #5 above, continue Plavix and statin  7)Anion gap metabolic acidosis in the setting of recurrent emesis--- anion gap was 20, bicarb was 18 with a glucose of 139--- hydrate IV n.p.o. -  With History of - Reviewed by me  Past Medical History:  Diagnosis Date   COVID-19    Hypertension       Past Surgical History:  Procedure Laterality Date   ABDOMINAL HYSTERECTOMY     APPENDECTOMY     TONSILLECTOMY        Chief Complaint  Patient presents with   Emesis      HPI:    Tiffany Shannon  is a 85 y.o. female with past medical history relevant for prior stroke, wheelchair-bound status and GERD who presents from home with persistent emesis lasting over 24 hours per daughter  Additional history  obtained from patient's daughter - pt is a DNR/DNI, d/w daughter [name] [id number],  -Patient's son [name] defers decisions to patient's daughter -Patient spouse currently admitted with hypoxic respiratory failure secondary to Covid 19 respiratory infection  -No high fevers, emesis is without blood or bile, last BM 11/13/2019 with loose -Cough is not very productive  No chest pains palpitations or dizziness  -In ED chest x-ray suggest possible right-sided opacity -O2 sats 97 to 99% on room air -Afebrile initially BP was elevated -In ED no increased work of breathing, no tachycardia -CR 14.1 H&H is stable platelets 392 -Lipase is  not elevated, procalcitonin less than 0.10 -Chemistry remarkable for bicarb of 18 anion gap of 20 T bili of 1.3 AST ALT not elevated, creatinine 0.67 -UA with negative leukocyte esterase negative nitrite patient does not protein or ketones as well as right bacteria and mucus       Review of systems:    In addition to the HPI above,   A full Review of  Systems was done, all other systems reviewed are negative except as noted above in HPI , .    Social History:  Reviewed by me    Social History   Tobacco Use   Smoking status: Never Smoker   Smokeless tobacco: Never Used  Substance Use Topics   Alcohol use: Never    Frequency: Never     Family History :  Reviewed by me  HTN   Home Medications:   Prior to Admission medications   Medication Sig Start Date End Date Taking? Authorizing Provider  clopidogrel (PLAVIX) 75 MG tablet Take 75 mg by mouth every morning. 07/29/19   [provider]  pantoprazole (PROTONIX) 40 MG tablet Take 40 mg by mouth 2 (two) times daily. 07/29/19   [provider]  simvastatin (ZOCOR) 10 MG tablet Take 1 tablet (10 mg total) by mouth daily at 6 PM for 30 days. 01/30/19 03/01/19  Manuella Ghazi, Pratik D, DO  solifenacin (VESICARE) 5 MG tablet Take 5 mg by mouth daily. 07/29/19   [provider]     Allergies:     Allergies  Allergen Reactions   Codeine Nausea And Vomiting     Physical Exam:   Vitals  Blood pressure (!) 159/71, pulse 95, temperature 98.3 F (36.8 C), temperature source Rectal, resp. rate (!) 27, weight 63 kg, SpO2 98 %.  Physical Examination: General appearance - alert, chronically ill-appearing, and in no distress Mental status - alert, oriented to person, place, and time,  Eyes - sclera anicteric Neck - supple, no JVD elevation , Chest - clear  to auscultation bilaterally, symmetrical air movement,  Heart - S1 and S2 normal, regular  Abdomen - soft, nontender, nondistended, no masses or  organomegaly Neurological - -chronic neuromuscular deficits/weakness at baseline patient is wheelchair-bound- but, no new focal neuro deficits Extremities - no pedal edema noted, intact peripheral pulses  Skin -neurofibromatosis type skin lesions especially on face and upper body    Data Review:    CBC Recent Labs  Lab 10/21/19 1555 10/28/2019 0914  WBC 8.1 14.1*  HGB 13.0 13.8  HCT 41.7 44.0  PLT 361 392  MCV 95.6 94.6  MCH 29.8 29.7  MCHC 31.2 31.4  RDW 14.1 14.2  LYMPHSABS 1.5 1.4  MONOABS 0.6 0.7  EOSABS 0.0 0.0  BASOSABS 0.0 0.0   ------------------------------------------------------------------------------------------------------------------  Chemistries  Recent Labs  Lab 10/21/19 1555 10/21/2019 0914  NA 138 138  K 4.5 3.7  CL 105 100  CO2 22 18*  GLUCOSE 97 139*  BUN 19 23  CREATININE 0.49 0.67  CALCIUM 8.6* 8.9  AST  --  14*  ALT  --  11  ALKPHOS  --  82  BILITOT  --  1.3*   ------------------------------------------------------------------------------------------------------------------ estimated creatinine clearance is 35.6 mL/min (by C-G formula based on SCr of 0.67 mg/dL). ------------------------------------------------------------------------------------------------------------------ No results for input(s): TSH, T4TOTAL, T3FREE, THYROIDAB in the last 72 hours.  Invalid input(s): FREET3   Coagulation profile No results for input(s): INR, PROTIME in the last 168 hours. ------------------------------------------------------------------------------------------------------------------- No results for input(s): DDIMER in the last 72 hours. -------------------------------------------------------------------------------------------------------------------  Cardiac Enzymes No results for input(s): CKMB, TROPONINI, MYOGLOBIN in the last 168 hours.  Invalid input(s):  CK ------------------------------------------------------------------------------------------------------------------ No results found for: BNP  --------------------------------------------------------------------------------------------------------------  Urinalysis    Component Value Date/Time   COLORURINE YELLOW 11/08/2019 1134   APPEARANCEUR CLOUDY (A) 11/13/2019 1134   LABSPEC 1.017 11/18/2019 1134   PHURINE 5.0 10/26/2019 1134   GLUCOSEU NEGATIVE 11/12/2019 1134   HGBUR SMALL (A) 10/21/2019 1134   BILIRUBINUR NEGATIVE 11/12/2019 1134   KETONESUR 80 (A) 11/08/2019 1134   PROTEINUR 100 (A) 10/20/2019 1134   UROBILINOGEN 0.2 09/30/2009 1909   NITRITE NEGATIVE 11/09/2019 1134   LEUKOCYTESUR NEGATIVE 11/09/2019 1134    ----------------------------------------------------------------------------------------------------------------   Imaging Results:    Dg Chest Port 1 View  Result Date: 11/18/2019 CLINICAL DATA:  COVID-19 positive.  Nausea, vomiting and diarrhea. EXAM: PORTABLE CHEST 1 VIEW COMPARISON:  10/21/2019 and 09/08/2019 FINDINGS: Lungs are adequately inflated with possible subtle hazy patchy density over the right midlung slightly more apparent. No effusion. Cardiomediastinal silhouette and remainder of the exam is unchanged. IMPRESSION: Subtle hazy density over the right midlung slightly more apparent and may be due to infection. Electronically Signed   By: Marin Olp M.D.   On: 10/31/2019 11:25   Dg Chest Port 1 View  Result Date: 10/21/2019 CLINICAL DATA:  COVID-19 positive, weakness EXAM: PORTABLE CHEST 1 VIEW COMPARISON:  01/28/2019 chest radiograph. FINDINGS: Stable cardiomediastinal silhouette with normal heart size. No pneumothorax. No pleural effusion. Chronic reticular opacity at the left costophrenic angle, unchanged. No acute consolidative airspace disease. Vertebroplasty material overlies the lower thoracic spine. IMPRESSION: No acute cardiopulmonary disease.  Chronic reticular opacity at the left costophrenic angle compatible with nonspecific mild fibrosis. Electronically Signed   By: Ilona Sorrel M.D.   On: 10/21/2019 12:49    Radiological Exams on Admission: Dg Chest Port 1 View  Result Date: 11/04/2019 CLINICAL DATA:  COVID-19 positive.  Nausea, vomiting and diarrhea. EXAM: PORTABLE CHEST 1 VIEW COMPARISON:  10/21/2019 and 09/08/2019 FINDINGS: Lungs are adequately inflated with possible subtle hazy patchy density over the right midlung slightly more apparent. No effusion. Cardiomediastinal silhouette and remainder of the exam is unchanged. IMPRESSION: Subtle hazy density over the right midlung slightly more apparent and may be due to infection. Electronically Signed   By: Marin Olp M.D.   On: 11/11/2019 11:25   Dg Chest Port 1 View  Result Date: 10/21/2019 CLINICAL DATA:  COVID-19 positive, weakness EXAM: PORTABLE CHEST 1 VIEW COMPARISON:  01/28/2019 chest radiograph. FINDINGS: Stable cardiomediastinal silhouette with normal heart size. No pneumothorax. No pleural effusion. Chronic reticular opacity at the left costophrenic angle, unchanged. No acute consolidative airspace disease. Vertebroplasty material overlies the lower thoracic spine. IMPRESSION: No acute cardiopulmonary disease. Chronic reticular opacity at the left costophrenic angle compatible with nonspecific mild fibrosis. Electronically Signed   By: Janina Mayo.D.  On: 10/21/2019 12:49   DVT Prophylaxis -SCD/Heparin AM Labs Ordered, also please review Full Orders  Family Communication: Admission, patients condition and plan of care including tests being ordered have been discussed with the patient and daughter/son who indicate understanding and agree with the plan   Code Status - DNR  Likely DC to  TBD  Condition   stable  Roxan Hockey M.D on 11/03/2019 at 3:21 PM Go to [email or web address] -  for contact info  [facility] - Office  [phone]

## 2019-10-22 NOTE — Progress Notes (Signed)
Consult request has been received. CSW attempting to follow up at present time  Makyi Ledo M. Antaeus Karel LCSWA Transitions of Care  Clinical Social Worker  Ph: [phone] 

## 2019-10-22 NOTE — ED Triage Notes (Signed)
Pt brought back in this morning after being seen yesterday. Husband who is caregiver is in covid hosp and pt has covid. Family states sick all night vomiting

## 2019-10-23 ENCOUNTER — Inpatient Hospital Stay (HOSPITAL_COMMUNITY): Payer: Medicare Other

## 2019-10-23 DIAGNOSIS — R112 Nausea with vomiting, unspecified: Secondary | ICD-10-CM

## 2019-10-23 DIAGNOSIS — Z8673 Personal history of transient ischemic attack (TIA), and cerebral infarction without residual deficits: Secondary | ICD-10-CM | POA: Diagnosis not present

## 2019-10-23 DIAGNOSIS — U071 COVID-19: Principal | ICD-10-CM

## 2019-10-23 DIAGNOSIS — E872 Acidosis: Secondary | ICD-10-CM

## 2019-10-23 DIAGNOSIS — E876 Hypokalemia: Secondary | ICD-10-CM

## 2019-10-23 DIAGNOSIS — R1115 Cyclical vomiting syndrome unrelated to migraine: Secondary | ICD-10-CM

## 2019-10-23 DIAGNOSIS — I10 Essential (primary) hypertension: Secondary | ICD-10-CM | POA: Diagnosis not present

## 2019-10-23 LAB — COMPREHENSIVE METABOLIC PANEL
ALT: 11 U/L (ref 0–44)
AST: 19 U/L (ref 15–41)
Albumin: 3.8 g/dL (ref 3.5–5.0)
Alkaline Phosphatase: 70 U/L (ref 38–126)
Anion gap: 17 — ABNORMAL HIGH (ref 5–15)
BUN: 22 mg/dL (ref 8–23)
CO2: 15 mmol/L — ABNORMAL LOW (ref 22–32)
Calcium: 8.4 mg/dL — ABNORMAL LOW (ref 8.9–10.3)
Chloride: 107 mmol/L (ref 98–111)
Creatinine, Ser: 0.53 mg/dL (ref 0.44–1.00)
GFR calc Af Amer: >60 mL/min (ref >60)
GFR calc non Af Amer: >60 mL/min (ref >60)
Glucose, Bld: 113 mg/dL — ABNORMAL HIGH (ref 70–99)
Potassium: 3.4 mmol/L — ABNORMAL LOW (ref 3.5–5.1)
Sodium: 139 mmol/L (ref 135–145)
Total Bilirubin: 1.6 mg/dL — ABNORMAL HIGH (ref 0.3–1.2)
Total Protein: 6.9 g/dL (ref 6.5–8.1)

## 2019-10-23 LAB — CBC WITH DIFFERENTIAL/PLATELET
Abs Immature Granulocytes: 0.06 10*3/uL (ref 0.00–0.07)
Basophils Absolute: 0 10*3/uL (ref 0.0–0.1)
Basophils Relative: 0 %
Eosinophils Absolute: 0 10*3/uL (ref 0.0–0.5)
Eosinophils Relative: 0 %
HCT: 40.3 % (ref 36.0–46.0)
Hemoglobin: 12.8 g/dL (ref 12.0–15.0)
Immature Granulocytes: 0 %
Lymphocytes Relative: 9 %
Lymphs Abs: 1.6 10*3/uL (ref 0.7–4.0)
MCH: 29.6 pg (ref 26.0–34.0)
MCHC: 31.8 g/dL (ref 30.0–36.0)
MCV: 93.3 fL (ref 80.0–100.0)
Monocytes Absolute: 1.5 10*3/uL — ABNORMAL HIGH (ref 0.1–1.0)
Monocytes Relative: 9 %
Neutro Abs: 14.2 10*3/uL — ABNORMAL HIGH (ref 1.7–7.7)
Neutrophils Relative %: 82 %
Platelets: 388 10*3/uL (ref 150–400)
RBC: 4.32 MIL/uL (ref 3.87–5.11)
RDW: 13.9 % (ref 11.5–15.5)
WBC: 17.5 10*3/uL — ABNORMAL HIGH (ref 4.0–10.5)
nRBC: 0 % (ref 0.0–0.2)

## 2019-10-23 LAB — C-REACTIVE PROTEIN: CRP: 11.4 mg/dL — ABNORMAL HIGH (ref <1.0)

## 2019-10-23 LAB — ABO/RH: ABO/RH(D): A POS

## 2019-10-23 LAB — FERRITIN: Ferritin: 149 ng/mL (ref 11–307)

## 2019-10-23 LAB — PROCALCITONIN: Procalcitonin: <0.1 ng/mL

## 2019-10-23 LAB — D-DIMER, QUANTITATIVE: D-Dimer, Quant: 0.58 ug/mL-FEU — ABNORMAL HIGH (ref 0.00–0.50)

## 2019-10-23 LAB — PHOSPHORUS: Phosphorus: 2.3 mg/dL — ABNORMAL LOW (ref 2.5–4.6)

## 2019-10-23 LAB — MAGNESIUM: Magnesium: 1.8 mg/dL (ref 1.7–2.4)

## 2019-10-23 MED ORDER — METHYLPREDNISOLONE SODIUM SUCC 40 MG IJ SOLR
40.0000 mg | Freq: Two times a day (BID) | INTRAMUSCULAR | Status: DC
  Administered 2019-10-23 (×2): 40 mg via INTRAVENOUS
  Filled 2019-10-23 (×2): qty 1

## 2019-10-23 MED ORDER — SODIUM BICARBONATE 8.4 % IV SOLN
INTRAVENOUS | Status: AC
  Filled 2019-10-23: qty 50

## 2019-10-23 MED ORDER — SORBITOL 70 % SOLN
960.0000 mL | TOPICAL_OIL | Freq: Once | ORAL | Status: AC
  Administered 2019-10-23: 960 mL via RECTAL
  Filled 2019-10-23: qty 473

## 2019-10-23 MED ORDER — METOCLOPRAMIDE HCL 5 MG/ML IJ SOLN
10.0000 mg | Freq: Once | INTRAMUSCULAR | Status: AC
  Administered 2019-10-23: 06:00:00 10 mg via INTRAVENOUS
  Filled 2019-10-23: qty 2

## 2019-10-23 MED ORDER — SODIUM BICARBONATE 8.4 % IV SOLN
50.0000 meq | Freq: Once | INTRAVENOUS | Status: AC
  Administered 2019-10-23: 10:00:00 50 meq via INTRAVENOUS

## 2019-10-23 MED ORDER — POTASSIUM CHLORIDE IN NACL 40-0.9 MEQ/L-% IV SOLN
INTRAVENOUS | Status: DC
  Administered 2019-10-23: 75 mL/h via INTRAVENOUS

## 2019-10-23 MED ORDER — PROCHLORPERAZINE EDISYLATE 10 MG/2ML IJ SOLN
10.0000 mg | Freq: Four times a day (QID) | INTRAMUSCULAR | Status: DC | PRN
  Administered 2019-10-23 (×2): 10 mg via INTRAVENOUS
  Filled 2019-10-23 (×2): qty 2

## 2019-10-23 MED ORDER — SODIUM CHLORIDE 0.9 % IV SOLN: INTRAVENOUS | Status: DC

## 2019-10-23 MED ORDER — METOCLOPRAMIDE HCL 5 MG/ML IJ SOLN
5.0000 mg | Freq: Three times a day (TID) | INTRAMUSCULAR | Status: DC
  Administered 2019-10-23 (×2): 5 mg via INTRAVENOUS
  Filled 2019-10-23 (×2): qty 2

## 2019-10-23 MED ORDER — ALBUTEROL SULFATE HFA 108 (90 BASE) MCG/ACT IN AERS
2.0000 | INHALATION_SPRAY | Freq: Four times a day (QID) | RESPIRATORY_TRACT | Status: DC | PRN
  Filled 2019-10-23: qty 6.7

## 2019-10-23 NOTE — Progress Notes (Signed)
Patient continues to vomit moderate amount of light brown emesis. MD made aware with new order for IV Reglan 10 mg.

## 2019-10-23 NOTE — Progress Notes (Signed)
PROGRESS NOTE    Tiffany Shannon  [id number] DOB: 11/08/1923 DOA: 10/28/2019 PCP: Tiffany Gravel, MD     Brief Narrative:  As per H&P written by Dr. Denton Shannon on 11/19/2019 85 y.o. female with past medical history relevant for prior stroke, wheelchair-bound status and GERD who presents from home with persistent emesis lasting over 24 hours per daughter  Additional history obtained from patient's daughter - pt is a DNR/DNI, d/w daughter Ms Tiffany Shannon [id number],  -Patient's son Tiffany Shannon defers decisions to patient's daughter -Patient spouse currently admitted with hypoxic respiratory failure secondary to Covid 19 respiratory infection  -No high fevers, emesis is without blood or bile, last BM 10/20/2019 with loose -Cough is not very productive  No chest pains palpitations or dizziness  -In ED chest x-ray suggest possible right-sided opacity -O2 sats 97 to 99% on room air -Afebrile initially BP was elevated -In ED no increased work of breathing, no tachycardia -CR 14.1 H&H is stable platelets 392 -Lipase is not elevated, procalcitonin less than 0.10 -Chemistry remarkable for bicarb of 18 anion gap of 20 T bili of 1.3 AST ALT not elevated, creatinine 0.67 -UA with negative leukocyte esterase negative nitrite patient does not protein or ketones as well as right bacteria and mucus  Assessment & Plan: 1-COVID-19 virus detected -Continue IV fluids and supportive care -Patient will be started on Solu-Medrol -No need for remdesivir at this time. -Good oxygen saturation on room air and not complaining of shortness of breath. -Follow clinical response. -Chest x-ray with left lower lobe infiltrates -Patient afebrile, no complaining of shortness of breath and with a stable procalcitonin level; continue holding on antibiotics at this time.  2-persistent nausea/vomiting -X-ray demonstrated no obstructions -Stool burden was appreciated; patient will receive an enema -Continue IV fluids  -Continue Reglan and as needed Compazine -Follow clinical response.  3-anion gap metabolic acidosis/hypokalemia -In the setting of GI losses dehydration -Continue IV fluids -Will provide sodium bicarb and continue hydration -Follow electrolytes trend -Replete potassium.  4-Essential hypertension -Stable overall -Continue monitoring vital signs.  5-H/O: CVA (cerebrovascular accident) -No new deficit appreciated -Continue Plavix and statin  6-generalized weakness/deconditioning -Patient at baseline is a wheelchair-bound -Prior history of back surgery and stroke, as triggering cause for her bedbound status. -Once medically stable will get physical therapy evaluation. -As plan of care discussed with family they want to hire private care and will assess for the need of home health services.  7-social/headache: Patient is DNR/DNI prior to admission. -will continue current treatment and respect her wishes.  DVT prophylaxis: Heparin Code Status: DNR/DNI Family Communication: No family at bedside. Disposition Plan: Remains inpatient, continue IV fluids and supportive care; start treatment with steroids, scheduled Reglan and as needed Compazine.  No meeting criteria for remdesivir at this point.  Consultants:   None  Procedures:   See below for x-ray reports; most recent abdominal/chest x-ray demonstrating stool borderline, no obstruction and left lower lobe infiltrate.  Antimicrobials:  Anti-infectives (From admission, onward)   None      Subjective: Afebrile, denies chest pain, oriented x3 with good oxygen saturation on room air.  Patient still having difficulty tolerating p.o.'s, with active nausea/vomiting.  Objective: Vitals:   10/23/19 0700 10/23/19 0813 10/23/19 1334 10/23/19 1347  BP:    120/68  Pulse:    (!) 105  Resp:    16  Temp:    98.3 F (36.8 C)  TempSrc:    Oral  SpO2:  99% 98% 98%  Weight: 51.1 kg  Intake/Output Summary (Last 24 hours) at  10/23/2019 1354 Last data filed at 10/23/2019 1254 Gross per 24 hour  Intake 1862.41 ml  Output 1 ml  Net 1861.41 ml   Filed Weights   10/23/2019 0755 10/23/19 0700  Weight: 63 kg 51.1 kg    Examination: General exam: Alert, awake, oriented x 3; afebrile and currently not requiring oxygen supplementation. Still actively experiencing nausea/vomiting and having difficulty tolerating p.o.'s. Respiratory system: Positive rhonchi, no wheezing, no using accessory muscles. Normal respiratory effort. Cardiovascular system: RRR. No murmurs, rubs, gallops. Gastrointestinal system: Abdomen is nondistended, soft and nontender on palpation. No organomegaly or masses felt. Normal bowel sounds heard. Central nervous system: Alert and oriented. No new focal neurological deficits. Extremities: No cyanosis or clubbing. Skin: No petechiae. 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 Psychiatry: Mood & affect appropriate.     Data Reviewed: I have personally reviewed following labs and imaging studies  CBC: Recent Labs  Lab 10/21/19 1555 10/23/2019 0914 10/23/19 0642  WBC 8.1 14.1* 17.5*  NEUTROABS 6.1 11.9* 14.2*  HGB 13.0 13.8 12.8  HCT 41.7 44.0 40.3  MCV 95.6 94.6 93.3  PLT 361 392 [id number]   Basic Metabolic Panel: Recent Labs  Lab 10/21/19 1555 11/02/2019 0914 10/23/19 0642  NA 138 138 139  K 4.5 3.7 3.4*  CL 105 100 107  CO2 22 18* 15*  GLUCOSE 97 139* 113*  BUN 19 23 22   CREATININE 0.49 0.67 0.53  CALCIUM 8.6* 8.9 8.4*  MG  --   --  1.8  PHOS  --   --  2.3*   GFR: Estimated Creatinine Clearance: 30.9 mL/min (by C-G formula based on SCr of 0.53 mg/dL).   Liver Function Tests: Recent Labs  Lab 11/14/2019 0914 10/23/19 0642  AST 14* 19  ALT 11 11  ALKPHOS 82 70  BILITOT 1.3* 1.6*  PROT 7.5 6.9  ALBUMIN 4.1 3.8   Recent Labs  Lab 10/20/2019 0914  LIPASE 17   Anemia Panel: Recent Labs    10/23/19 0642  FERRITIN 149   Urine analysis:    Component  Value Date/Time   COLORURINE YELLOW 11/07/2019 1134   APPEARANCEUR CLOUDY (A) 10/21/2019 1134   LABSPEC 1.017 11/19/2019 1134   PHURINE 5.0 11/18/2019 1134   GLUCOSEU NEGATIVE 11/11/2019 1134   HGBUR SMALL (A) 10/29/2019 1134   BILIRUBINUR NEGATIVE 11/04/2019 1134   KETONESUR 80 (A) 11/05/2019 1134   PROTEINUR 100 (A) 10/30/2019 1134   UROBILINOGEN 0.2 09/30/2009 1909   NITRITE NEGATIVE 11/15/2019 1134   LEUKOCYTESUR NEGATIVE 10/29/2019 1134    Recent Results (from the past 240 hour(s))  SARS CORONAVIRUS 2 (TAT 6-24 HRS) Nasopharyngeal Nasopharyngeal Swab     Status: Abnormal   Collection Time: 10/21/19  2:45 PM   Specimen: Nasopharyngeal Swab  Result Value Ref Range Status   SARS Coronavirus 2 POSITIVE (A) NEGATIVE Final    Comment: EMAILED TO LORI BERDIK AT 0016 ON 10/21/2019 BY SAINVILUS S (NOTE) SARS-CoV-2 target nucleic acids are DETECTED. The SARS-CoV-2 RNA is generally detectable in upper and lower respiratory specimens during the acute phase of infection. Positive results are indicative of the presence of SARS-CoV-2 RNA. Clinical correlation with patient history and other diagnostic information is  necessary to determine patient infection status. Positive results do not rule out bacterial infection or co-infection with other viruses.  The expected result is Negative. Fact Sheet for Patients: SugarRoll.be Fact Sheet for Healthcare Providers: https://www.woods-mathews.com/ This test is not yet approved or cleared by  the [employer] and  has been authorized for detection and/or diagnosis of SARS-CoV-2 by FDA under an Emergency Use Authorization (EUA). This EUA will remain  in effect (meaning this test can be used) for the duration of the COVID-19  declaration under Section 564(b)(1) of the Act, 21 U.S.C. section 360bbb-3(b)(1), unless the authorization is terminated or revoked sooner. Performed at [facility],  [place] [address] [place], [place] [address]   Culture, Urine     Status: Abnormal (Preliminary result)   Collection Time: 10/28/2019 11:34 AM   Specimen: Urine, Clean Catch  Result Value Ref Range Status   Specimen Description   Final    URINE, CLEAN CATCH Performed at [facility], [address], [place], [place] [address]    Special Requests NONE  Final   Culture (A)  Final    >=100,000 COLONIES/mL GRAM NEGATIVE RODS IDENTIFICATION AND SUSCEPTIBILITIES TO FOLLOW CULTURE REINCUBATED FOR BETTER GROWTH Performed at [facility], [address] [place], [place] [address]    Report Status PENDING  Incomplete  Culture, blood (Routine X 2) w Reflex to ID Panel     Status: None (Preliminary result)   Collection Time: 11/19/2019  3:44 PM   Specimen: Right Antecubital; Blood  Result Value Ref Range Status   Specimen Description RIGHT ANTECUBITAL  Final   Special Requests   Final    BOTTLES DRAWN AEROBIC AND ANAEROBIC Blood Culture adequate volume   Culture   Final    NO GROWTH < 24 HOURS Performed at [facility], [address] [place], [place] [address]    Report Status PENDING  Incomplete  Culture, blood (Routine X 2) w Reflex to ID Panel     Status: None (Preliminary result)   Collection Time: 11/18/2019  3:44 PM   Specimen: BLOOD RIGHT FOREARM  Result Value Ref Range Status   Specimen Description BLOOD RIGHT FOREARM  Final   Special Requests   Final    BOTTLES DRAWN AEROBIC AND ANAEROBIC Blood Culture adequate volume   Culture   Final    NO GROWTH < 24 HOURS Performed at [facility], [address], [place], [place] [address]    Report Status PENDING  Incomplete     Radiology Studies: Dg Chest Port 1 View  Result Date: 11/01/2019 CLINICAL DATA:  COVID-19 positive.  Nausea, vomiting and diarrhea. EXAM: PORTABLE CHEST 1 VIEW COMPARISON:  10/21/2019 and 09/08/2019 FINDINGS: Lungs are adequately inflated with possible subtle hazy patchy density over the right midlung slightly more  apparent. No effusion. Cardiomediastinal silhouette and remainder of the exam is unchanged. IMPRESSION: Subtle hazy density over the right midlung slightly more apparent and may be due to infection. Electronically Signed   By: Marin Olp M.D.   On: 11/18/2019 11:25   Dg Abd Acute 2+v W 1v Chest  Result Date: 10/23/2019 CLINICAL DATA:  Nausea, COVID positive EXAM: DG ABDOMEN ACUTE W/ 1V CHEST COMPARISON:  Chest radiograph dated 11/18/2019 FINDINGS: Patchy/nodular opacity in the left upper lobe. Increased interstitial markings. Mild left basilar opacity. No pleural effusion or pneumothorax. The heart is normal in size.  Thoracic aortic atherosclerosis. Nonobstructive bowel gas pattern.  Moderate rectal stool burden. Left lower posterior rib fracture deformity. Prior vertebral augmentation at T12. IMPRESSION: Mild patchy left lung opacities in this patient with known COVID. Moderate rectal stool burden, suggesting mild fecal impaction. Electronically Signed   By: Julian Hy M.D.   On: 10/23/2019 10:20    Scheduled Meds: .  albuterol  2 puff Inhalation Q6H  . atorvastatin  20 mg Oral q1800  . clopidogrel  75 mg Oral q morning - 10a  . darifenacin  7.5 mg Oral Daily  . folic acid  1 mg Oral Daily  . heparin  5,000 Units Subcutaneous Q8H  . methylPREDNISolone (SOLU-MEDROL) injection  40 mg Intravenous Q12H  . metoCLOPramide (REGLAN) injection  5 mg Intravenous Q8H  . multivitamin with minerals  1 tablet Oral Daily  . pantoprazole  40 mg Oral BID  . sodium chloride flush  3 mL Intravenous Q12H  . sorbitol, milk of mag, mineral oil, glycerin (SMOG) enema  960 mL Rectal Once  . thiamine  100 mg Oral Daily  . vitamin C  500 mg Oral Daily  . zinc sulfate  220 mg Oral Daily   Continuous Infusions: . sodium chloride    . 0.9 % NaCl with KCl 40 mEq / L 75 mL/hr (10/23/19 1005)     LOS: 1 day    Time spent: 30 minutes.   Barton Dubois, MD [facility] Pager [phone]    10/23/2019, 1:54 PM

## 2019-10-23 NOTE — Progress Notes (Signed)
Had not vomited all day until supper tray.  Gave compazine and ordered enema.  Enema yielded small amount of brown liquid stool.  Emesis is green.

## 2019-10-24 LAB — URINE CULTURE: Culture: >=100000 — AB

## 2019-10-27 LAB — CULTURE, BLOOD (ROUTINE X 2)
Culture: NO GROWTH
Culture: NO GROWTH
Special Requests: ADEQUATE
Special Requests: ADEQUATE

## 2019-11-20 NOTE — Discharge Summary (Signed)
Death Summary  Tiffany Shannon [id number] DOB: 1923-08-25 DOA: 11/02/19  PCP: [name], MD PCP/Office notified: notified through [place].  Admit date: 2019-11-02 Date of Death: 11-04-2019  Final Diagnoses:  Principal Problem:   COVID-19 virus detected Active Problems:   Essential hypertension   Emesis, persistent   H/O: CVA (cerebrovascular accident) metabolic acidosis Hypokalemia HTN DNR/DNI Weakness/conditioning/wheelchair bound   History of present illness:  As per H&P written by Dr. Denton Brick on November 02, 2019 86 y.o.femalewith past medical history relevant for prior stroke, wheelchair-bound status andGERD who presents from home with persistent emesis lasting over 24 hours per daughter  Additional history obtained from patient's daughter -pt is a DNR/DNI, d/w daughter [name] [phone],  -Patient's son [name] defers decisions to patient's daughter -Patient spouse currently admitted with hypoxic respiratory failure secondary to Covid 19 respiratory infection  -No high fevers, emesis is without blood or bile, last BM Nov 02, 2019 with loose -Cough is not very productive  No chest pains palpitations or dizziness  -In ED chest x-ray suggest possible right-sided opacity -O2 sats 97 to 99% on room air -Afebrile initially BP was elevated -In ED no increased work of breathing,no tachycardia -CR 14.1 H&H is stable platelets 392 -Lipase is not elevated,procalcitonin less than 0.10 -Chemistry remarkable for bicarb of 18 anion gap of 20 T bili of 1.3 AST ALT not elevated, creatinine 0.67 -UA with negative leukocyte esterase negative nitrite patient does not protein or ketones as well as right bacteria and mucus  Hospital Course:  COVID-19 infection: -Patient was receiving supportive care and IV Solu-Medrol -Based on clinical description no need for remdesivir -Afebrile, good oxygen saturation on room air and no complaining of shortness of breath. -Chest x-ray  demonstrating left lower lobe infiltrates. -Despite treatment and supportive care patient was found to be pulseless and not breathing around 1:47 in the morning. -Patient was DNR/DNI and plan of care mainly driven for comfort, while treating what was treatable. -family informed and tankful.  2-persistent nausea/vomiting -X-ray demonstrated no bowel obstructions -Stool burden was appreciated; and planned was to provide enema to assist with ongoing constipation. -kept on IV fluids -started on Reglan and as needed Compazine  3-anion gap metabolic acidosis/hypokalemia -In the setting of GI losses dehydration -was receiving IV fluids resuscitation and electrolyte repletion   4-Essential hypertension -Stable overall  5-H/O: CVA (cerebrovascular accident) -No new deficit appreciated -patient kept on Plavix and statin  6-generalized weakness/deconditioning -Patient at baseline is a wheelchair-bound -Prior history of back surgery and stroke, as triggering cause for her bedbound status. -Once medically stable plans was to get physical therapy evaluation to determine discharge pathway.  7-social/ethics:  -Patient was DNR/DNI prior to admission. -main goal in her plan of care was to be comfortable while treating the treatable.    Time: 25 minutes  Signed:  Barton Dubois  [facility] 11-04-19, 12:32 PM

## 2019-11-20 NOTE — Care Management Important Message (Signed)
Important Message  Patient Details  Name: Tiffany Shannon MRN: [id number] Date of Birth: 25-May-1923   Medicare Important Message Given:  Yes     Tommy Medal 10/26/2019, 11:00 AM

## 2019-11-20 NOTE — Progress Notes (Signed)
Making rounds per facility protocol  and noticed patient not breathing and pulselessness at 0147. Dr. Scherrie November notified. Daughter notified and thanked nurse for taking care of her mother and for calling.

## 2019-11-20 DEATH — deceased
